# Patient Record
Sex: Female | Born: 1985 | Race: White | Hispanic: No | Marital: Married | Smoking: Never smoker
Health system: Southern US, Community
[De-identification: ages and names within clinical notes are randomized; demographics above are authoritative.]

## PROBLEM LIST (undated history)

## (undated) DIAGNOSIS — T4145XA Adverse effect of unspecified anesthetic, initial encounter: Secondary | ICD-10-CM

## (undated) DIAGNOSIS — K219 Gastro-esophageal reflux disease without esophagitis: Secondary | ICD-10-CM

## (undated) DIAGNOSIS — L309 Dermatitis, unspecified: Secondary | ICD-10-CM

## (undated) DIAGNOSIS — T8859XA Other complications of anesthesia, initial encounter: Secondary | ICD-10-CM

## (undated) HISTORY — DX: Other complications of anesthesia, initial encounter: T88.59XA

## (undated) HISTORY — DX: Gastro-esophageal reflux disease without esophagitis: K21.9

## (undated) HISTORY — DX: Adverse effect of unspecified anesthetic, initial encounter: T41.45XA

## (undated) HISTORY — DX: Dermatitis, unspecified: L30.9

---
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
## 2009-11-01 ENCOUNTER — Ambulatory Visit: Payer: Self-pay | Admitting: Advanced Practice Midwife

## 2010-07-29 NOTE — Assessment & Plan Note (Signed)
NAME:  [name] [name], [name] [name]            ACCOUNT NO.:  [id number]   MEDICAL RECORD NO.:  [id number]          PATIENT TYPE:  POB   LOCATION:  [facility] at [place]         FACILITY:  [facility]   PHYSICIAN:  Wynelle Bourgeois, CNM    DATE OF BIRTH:  04/19/1985   DATE OF SERVICE:  11/01/2009                                  CLINIC NOTE   This is a 25 year old, gravida 0 who is not pregnant and presents for  her annual exam.  She is generally healthy and is requesting a  prescription for her Cyclessa birth control pills.  She is also  considering trying to conceive within the next year and is requesting a  prescription for prenatal vitamins.  She denies any problems with her  health at this time.  She desires a Pap smear, but declines any STD  testing.   HEALTH CARE MAINTENANCE:  The patient has had rubella chickenpox tetanus  and flu vaccines.  Last mammogram was in 2010 and was normal.  Last  dental exam was in February 2011 and was normal.   GYNECOLOGIC HISTORY:  Age of first menses is 12.  Periods are regular  every 29 days and last for 4 days.  She does report them has having  medium flow with mild cramps.   OBSTETRICAL HISTORY:  The patient is a nulligravida.  The patient has  used Cyclessa birth control pills without problems and denies any chest  pain or headaches.  Last Pap was in June 2009 and paps have always been  normal.  There is no history of STDs.   MEDICAL HISTORY:  The patient reports having exercise-induced asthma in  middle school, but has no current problem with that.  She denies any  other health problems.   SURGICAL HISTORY:  Remarkable for wisdom teeth extraction in 2003.   SOCIAL HISTORY:  The patient is employed as a Tax adviser.  She is married and lives with her husband.  She denies  tobacco use.  She does report social alcohol use.  She denies any drug  use.  Lifetime partner is one.   FAMILY HISTORY:  Remarkable for a grandmother with diabetes and  other  family members with heart disease and high blood pressure.   REVIEW OF SYSTEMS:  The patient reports some chest discomfort with  asthma.  She denies any other symptoms.   PHYSICAL EXAMINATION:  Pulse 94, blood pressure 140/87 (the patient  states that she has white coat syndrome and that her blood pressures are  otherwise normal), weight 219, height 68 inches.   ALLERGIES:  NKDA.  LMP October 08, 2009.   PHYSICAL EXAMINATION:  HEENT:  Within normal limits.  Thyroid normal,  not enlarged.  CHEST:  Clear to auscultation.  HEART:  Regular rate and rhythm.  ABDOMEN:  Soft, nontender, no masses.  Abdomen is slightly obese.  GYNECOLOGIC:  EG/BUS within normal limits.  No lesions or discharge are  noted.  Vagina is clean, well rugated with no lesions.  Cervix is  closed, nulliparous, and normal in appearance.  Uterus is small,  midline.  Adnexa are clear with no masses appreciated, although body  habitus limits exam somewhat.  RECTAL:  Deferred.  EXTREMITIES:  Within normal limits.  Pap smear obtained.  The patient  declines STD testing.   ASSESSMENT:  46. A 25 year old gravida 0 presenting for annual exam.  2. Normal exam.   PLAN:  1. [facility] handout on health maintenance given to the      patient.  2. Pap sent.  3. Prescription for Cyclessa 1 p.o. daily #3 months, refilled x1 year      given.  4. Prescription prenatal vitamin 1 p.o. daily #30, refilled x1 year.  5. The patient will return when pregnancy or as needed.  Otherwise;      will return for annual in 1 year.           ______________________________  Wynelle Bourgeois, CNM     MW/MEDQ  D:  11/01/2009  T:  11/02/2009  Job:  [phone]

## 2010-11-06 ENCOUNTER — Encounter: Payer: Self-pay | Admitting: *Deleted

## 2010-11-26 ENCOUNTER — Ambulatory Visit (INDEPENDENT_AMBULATORY_CARE_PROVIDER_SITE_OTHER): Payer: BC Managed Care – PPO | Admitting: *Deleted

## 2010-11-26 ENCOUNTER — Encounter: Payer: Self-pay | Admitting: *Deleted

## 2010-11-26 VITALS — BP 141/70 | Temp 98.6°F | Ht 66.0 in | Wt 208.0 lb

## 2010-11-26 DIAGNOSIS — Z3480 Encounter for supervision of other normal pregnancy, unspecified trimester: Secondary | ICD-10-CM

## 2010-11-26 LAB — ABO/RH: RH Type: POSITIVE

## 2010-11-26 LAB — CBC: Platelets: 281 10*3/uL (ref 150–399)

## 2010-11-26 LAB — HEPATITIS B SURFACE ANTIGEN: Hepatitis B Surface Ag: NEGATIVE

## 2010-11-26 NOTE — Progress Notes (Signed)
Pt here with husband for NOB intake.  Medical history reviewed with pt.  Pt and husband are excited about pregnancy and this is a planned pregnancy as pt went off OCP several months ago.  Genetic screening reviewed with pt and integrated info given.  Bedside U/S showed IUP with GA of [redacted]wks and positive FHT of 133BPM.  PN Labs drawn including CF screening.  Pt is currently taking PN vitamins and has been x 1 year.  Pt scheduled for PN exam 12/08/10 and will need a pap smear at that time.  All questions answered and encourage or to call if any questions or concerns.

## 2010-11-27 LAB — OBSTETRIC PANEL
Antibody Screen: NEGATIVE
Basophils Absolute: 0 10*3/uL (ref 0.0–0.1)
Basophils Relative: 0 % (ref 0–1)
Eosinophils Absolute: 0.1 10*3/uL (ref 0.0–0.7)
HCT: 37.9 % (ref 36.0–46.0)
Hemoglobin: 12.2 g/dL (ref 12.0–15.0)
MCH: 29.4 pg (ref 26.0–34.0)
MCHC: 32.2 g/dL (ref 30.0–36.0)
Monocytes Absolute: 0.7 10*3/uL (ref 0.1–1.0)
Monocytes Relative: 8 % (ref 3–12)
Neutro Abs: 6.5 10*3/uL (ref 1.7–7.7)
RDW: 13.3 % (ref 11.5–15.5)
Rh Type: POSITIVE

## 2010-11-27 LAB — HIV ANTIBODY (ROUTINE TESTING W REFLEX): HIV: NONREACTIVE

## 2010-11-30 LAB — CULTURE, URINE COMPREHENSIVE: Colony Count: 15000

## 2010-12-08 ENCOUNTER — Ambulatory Visit (INDEPENDENT_AMBULATORY_CARE_PROVIDER_SITE_OTHER): Payer: BC Managed Care – PPO | Admitting: Physician Assistant

## 2010-12-08 VITALS — BP 145/80 | Temp 97.9°F | Wt 209.0 lb

## 2010-12-08 DIAGNOSIS — Z3400 Encounter for supervision of normal first pregnancy, unspecified trimester: Secondary | ICD-10-CM

## 2010-12-08 LAB — CYSTIC FIBROSIS DIAGNOSTIC STUDY: Date of Birth: [birth year]

## 2010-12-08 NOTE — Patient Instructions (Signed)
Pregnancy - First Trimester During sexual intercourse, millions of sperm go into the vagina. Only 1 sperm will penetrate and fertilize the female egg while it is in the Fallopian tube. One week later, the fertilized egg implants into the wall of the uterus. An embryo begins to develop into a baby. At 6 to 8 weeks, the eyes and face are formed and the heartbeat can be seen on ultrasound. At the end of 12 weeks (first trimester), all the baby's organs are formed. Now that you are pregnant, you will want to do everything you can to have a healthy baby. Two of the most important things are to get good prenatal care and follow your caregiver's instructions. Prenatal care is all the medical care you receive before the baby's birth. It is given to prevent, find and treat problems during the pregnancy and childbirth. PRENATAL EXAMS:  During prenatal visits, your weight, blood pressure and urine are checked. This is done to make sure you are healthy and progressing normally during the pregnancy.   A pregnant woman should gain 25 to 35 pounds during the pregnancy. However, if you are over weight or underweight, your caregiver will advise you regarding your weight.   Your caregiver will ask and answer questions for you.   Blood work, cervical cultures, other necessary tests and a Pap test are done during your prenatal exams. These tests are done to check on your health and the probable health of your baby. Tests are strongly recommended and done for HIV with your permission. This is the virus that causes AIDS. These tests are done because medications can be given to help prevent your baby from being born with this infection should you have been infected without knowing it. Blood work is also used to find out your blood type, previous infections and follow your blood levels (hemoglobin).   Low hemoglobin (anemia) is common during pregnancy. Iron and vitamins are given to help prevent this. Later in the pregnancy,  blood tests for diabetes will be done along with any other tests if any problems develop. You may need tests to make sure you and the baby are doing well.   You may need other tests to make sure you and the baby are doing well.  CHANGES DURING THE FIRST TRIMESTER (THE FIRST 3 MONTHS OF PREGNANCY) Your body goes through many changes during pregnancy. They vary from person to person. Talk to your caregiver about changes you notice and are concerned about. Changes can include:  Your menstrual period stops.   The egg and sperm carry the genes that determine what you look like. Genes from you and your partner are forming a baby. The female genes determine whether the baby is a boy or a girl.   Your body increases in girth and you may feel bloated.   Feeling sick to your stomach (nauseous) and throwing up (vomiting). If the vomiting is uncontrollable, call your caregiver.   Your breasts will begin to enlarge and become tender.   Your nipples may stick out more and become darker.   The need to urinate more. Painful urination may mean you have a bladder infection.   Tiring easily.   Loss of appetite.   Cravings for certain kinds of food.   At first, you may gain or lose a couple of pounds.   You may have changes in your emotions from day to day (excited to be pregnant or concerned something may go wrong with the pregnancy and baby).     You may have more vivid and strange dreams.  HOME CARE INSTRUCTIONS  It is very important to avoid all smoking, alcohol and un-prescribed drugs during your pregnancy. These affect the formation and growth of the baby. Avoid chemicals while pregnant to ensure the delivery of a healthy infant.   Start your prenatal visits by the 12th week of pregnancy. They are usually scheduled monthly at first, then more often in the last 2 months before delivery. Keep your caregiver's appointments. Follow your caregiver's instructions regarding medication use, blood and lab  tests, exercise, and diet.   During pregnancy, you are providing food for you and your baby. Eat regular, well-balanced meals. Choose foods such as meat, fish, milk and other low fat dairy products, vegetables, fruits, and whole-grain breads and cereals. Your caregiver will tell you of the ideal weight gain.   You can help morning sickness by keeping soda crackers (saltines) at the bedside. Eat a couple before arising in the morning. You may want to use the crackers without salt on them.   Eating 4 to 5 small meals rather than 3 large meals a day also may help the nausea and vomiting.   Drinking liquids between meals instead of during meals also seems to help nausea and vomiting.   A physical sexual relationship may be continued throughout pregnancy if there are no other problems. Problems may be early (premature) leaking of amniotic fluid from the membranes, vaginal bleeding, or belly (abdominal) pain.   Exercise regularly if there are no restrictions. Check with your caregiver or physical therapist if you are unsure of the safety of some of your exercises. Greater weight gain will occur in the last 2 trimesters of pregnancy. Exercising will help:   Control your weight.   Keep you in shape.   Prepare you for labor and delivery.   Help you lose your pregnancy weight after you deliver your baby.   Wear a good support or jogging bra for breast tenderness during pregnancy. This may help if worn during sleep too.   Ask when prenatal classes are available. Begin classes when they are offered.   Do not use hot tubs, steam rooms or saunas.   Wear your seat belt when driving. This protects you and your baby if you are in an accident.   Avoid raw meat, uncooked cheese, cat litter boxes and soil used by cats throughout the pregnancy. These carry germs that can cause birth defects in the baby.   The first trimester is a good time to visit your dentist for your dental health. Getting your teeth  cleaned is OK. Use a softer toothbrush and brush gently during pregnancy.   Ask for help if you have financial, counseling or nutritional needs during pregnancy. Your caregiver will be able to offer counseling for these needs as well as refer you for other special needs.   Do not take any medications or herbs unless told by your caregiver.   Inform your caregiver if there is any mental or physical domestic violence.   Make a list of emergency phone numbers of family, friends, hospital, police and fire department.   Write down your questions. Take them to your prenatal visit.   Do not douche.   Do not cross your legs.   If you have to stand for long periods of time, rotate you feet or take small steps in a circle.   You may have more vaginal secretions that may require a sanitary pad. Do not use tampons   or scented sanitary pads.  MEDICATIONS AND DRUG USE IN PREGNANCY  Take prenatal vitamins as directed. The vitamin should contain 1 milligram of folic acid. Keep all vitamins out of reach of children. Only a couple vitamins or tablets containing iron may be fatal to a baby or young child when ingested.   Avoid use of all medications, including herbs, over-the-counter medications, not prescribed or suggested by your caregiver. Only take over-the-counter or prescription medicines for pain, discomfort, or fever as directed by your caregiver. Do not use aspirin, ibuprofen (Motrin, Advil, Nuprin) or naproxen (Aleve) unless OK'd by your caregiver.   Let your caregiver also know about herbs you may be using.   Alcohol is related to a number of birth defects. This includes fetal alcohol syndrome. All alcohol, in any form, should be avoided completely. Smoking will cause low birth rate and premature babies.   Street/illegal drugs are very harmful to the baby. They are absolutely forbidden. A baby born to an addicted mother will be addicted at birth. The baby will go through the same withdrawal  an adult does.   Let your caregiver know about any medications that you have to take and for what reason you take them.  MISCARRIAGE IS COMMON DURING PREGNANCY A miscarriage does not mean you did something wrong. It is not a reason to worry about getting pregnant again. Your caregiver will help you with questions you may have. If you have a miscarriage, you may need minor surgery (a D & C). SEEK MEDICAL CARE IF:  You have any concerns or worries during your pregnancy. It is better to call with your questions if you feel they cannot wait, rather than worry about them.  SEEK IMMEDIATE MEDICAL CARE IF:  An unexplained oral temperature above 100.5 develops, or as your caregiver suggests.   You have leaking of fluid from the vagina (birth canal). If leaking membranes are suspected, take your temperature and inform your caregiver of this when you call.   There is vaginal spotting or bleeding. Notify your caregiver of the amount and how many pads are used.   You develop a bad smelling vaginal discharge with a change in the color.   You continue to feel sick to your stomach (nauseated) and have no relief from remedies suggested. You vomit blood or coffee ground like materials.   You lose more than 2 pounds of weight in one week.   You gain more than 2 pounds of weight in a week and you notice swelling of your face, hands, feet or legs.   You gain 5 pounds or more in 1 week (even if you do not have swelling of your hands, face, legs or feet).   You get exposed to German measles and have never had them.   You are exposed to fifth disease or chicken pox.   You develop belly (abdominal) pain. Round ligament discomfort is a common non-cancerous (benign) cause of abdominal pain in pregnancy. Your caregiver still must evaluate this.   You develop headache, fever, diarrhea, pain with urination, or shortness of breath.   You fall, are in a car accident or have any kind of trauma.   There is mental  or physical violence in your home.  Document Released: 02/24/2001 Document Re-Released: 08/20/2009 [employer] Patient Information 2011 [employer]. 

## 2010-12-08 NOTE — Progress Notes (Signed)
No hx of HTN. Will perform BL 24 hour urine and labs at 12 week visit. Declines integrated screen.   Subjective:    [name] [name] is a G1P0000 [redacted]w[redacted]d being seen today for her first obstetrical visit.  Her obstetrical history is significant for ? HTN.  Pregnancy history fully reviewed. Pt reports elevated BP at Annual exam visit last year, however, was told NL BP at last two PCP visits. Taking Sudafed for cold s/s.  Patient reports no complaints.  Filed Vitals:   12/08/10 1422  BP: 145/80  Temp: 97.9 F (36.6 C)  Weight: 209 lb (94.802 kg)    HISTORY: OB History    Grav Para Term Preterm Abortions TAB SAB Ect Mult Living   1 0 0 0 0 0 0 0 0 0      # Outc Date GA Lbr Len/2nd Wgt Sex Del Anes PTL Lv   1 CUR              Past Medical History  Diagnosis Date  . Asthma     exercise induced in middle school  . Complication of anesthesia     laughing gas, dreaming episode   Past Surgical History  Procedure Date  . Wisdom tooth extraction 2003   Family History  Problem Relation Age of Onset  . Drug abuse Maternal Grandmother   . Heart disease Maternal Grandmother   . Diabetes Paternal Grandmother   . Hypertension Paternal Grandmother   . Stroke Paternal Grandfather   . COPD Paternal Grandfather      Exam    Uterine Size: 10 cm                                 System: Breast:  normal appearance, no masses or tenderness, No nipple retraction or dimpling, No nipple discharge or bleeding   Skin: normal coloration and turgor, no rashes    Neurologic: oriented, normal, negative   Extremities: normal strength, tone, and muscle mass   HEENT thyroid without masses   Mouth/Teeth mucous membranes moist, pharynx normal without lesions and dental hygiene good   Neck supple and no masses   Cardiovascular: regular rate and rhythm, no murmurs or gallops   Respiratory:  appears well, vitals normal, no respiratory distress, acyanotic, normal RR, ear and throat exam is normal,  neck free of mass or lymphadenopathy, chest clear, no wheezing, crepitations, rhonchi, normal symmetric air entry   Abdomen: soft, non-tender; bowel sounds normal; no masses,  no organomegaly          Assessment:    Pregnancy: G1P0000 There is no problem list on file for this patient.       Plan:     Initial labs drawn.Last Pap 10/2009, no hx of abnormal Prenatal vitamins. Problem list reviewed and updated. Genetic Screening discussed Integrated Screen: declined.  Ultrasound discussed; fetal survey: to be assessed at 18 weeks.  Follow up in 3 weeks. BL Pre-x labs & 24 hour urine at 12 week visit  Pryor Guettler E. 12/08/2010

## 2010-12-10 ENCOUNTER — Encounter: Payer: Self-pay | Admitting: *Deleted

## 2010-12-29 ENCOUNTER — Ambulatory Visit (INDEPENDENT_AMBULATORY_CARE_PROVIDER_SITE_OTHER): Payer: BC Managed Care – PPO | Admitting: Advanced Practice Midwife

## 2010-12-29 DIAGNOSIS — T8859XA Other complications of anesthesia, initial encounter: Secondary | ICD-10-CM

## 2010-12-29 DIAGNOSIS — J45990 Exercise induced bronchospasm: Secondary | ICD-10-CM | POA: Insufficient documentation

## 2010-12-29 DIAGNOSIS — Z23 Encounter for immunization: Secondary | ICD-10-CM

## 2010-12-29 DIAGNOSIS — Z3480 Encounter for supervision of other normal pregnancy, unspecified trimester: Secondary | ICD-10-CM

## 2010-12-29 DIAGNOSIS — T4145XA Adverse effect of unspecified anesthetic, initial encounter: Secondary | ICD-10-CM | POA: Insufficient documentation

## 2010-12-29 NOTE — Patient Instructions (Signed)
rine Protein, 24 Hour Urine Protein   This is a test used to detect excessive protein escaping into the urine, to help evaluate and monitor kidney function, and to detect kidney damage. Proteins usually are not found in the urine. The kidneys (two organs found in the back at the bottom of the rib cage) filter the blood, removing wastes and excreting them out of the body in the form of urine. When the kidneys are functioning normally, they retain or reabsorb filtered protein molecules and return them to the blood. However, if the kidneys are damaged, they become less effective at filtering, and detectible amounts of protein begin to find their way into the urine. Often, it is the smaller albumin molecules that are detected first. If the damage continues, the amount of protein in the urine increases, and globulins may also begin to be lost.    Proteinuria (protein in the urine) is frequently seen in chronic diseases, such as diabetes and hypertension, with increasing amounts of protein in the urine reflecting increasing kidney damage. With early kidney damage, the patient is often asymptomatic. As damage progresses or if protein loss is severe, the patient may have symptoms such as edema (swelling and fluid retention), shortness of breath, nausea, and fatigue. Excess protein production, such as may be seen with multiple myeloma, can also lead to proteinuria.   PREPARATION A random urine sample is collected in a clean container. For a 24-hour urine collection, all of the urine is collected for a 24-hour period. It is important that the sample be refrigerated during this time period. There should be no preservative in the container.   PREPARATION FOR TEST: A blood sample is obtained by inserting a needle into a vein in the arm.   NORMAL RANGE VALUES: 0-8 mg/dL 11-91 YN/82 hr (at rest) Less than 250 mg/24 hr (during exercise)   YOUR VALUE WAS: __________________________________________________     MEANING OF TEST Your caregiver will go over the test results with you and discuss the importance and meaning of your results, as well as treatment options and the need for additional tests if necessary.   OBTAINING THE TEST RESULTS It is your responsibility to obtain your test results.  Ask the lab or department performing the test when and how you will get your results.   **"Normal" ranges for lab values and other tests may vary among different laboratories and/or hospitals.  You should always check with your doctor after having lab work or other tests done to discuss the meaning of your test results and whether or not your values are considered "within normal limits".   Document Released: 03/27/2004  Document Re-Released: 02/13/2008 [facility] Patient Information 2011 [place], [place].  Pregnancy - First Trimester During sexual intercourse, millions of sperm go into the vagina. Only 1 sperm will penetrate and fertilize the female egg while it is in the Fallopian tube. One week later, the fertilized egg implants into the wall of the uterus. An embryo begins to develop into a baby. At 6 to 8 weeks, the eyes and face are formed and the heartbeat can be seen on ultrasound. At the end of 12 weeks (first trimester), all the baby's organs are formed. Now that you are pregnant, you will want to do everything you can to have a healthy baby. Two of the most important things are to get good prenatal care and follow your caregiver's instructions. Prenatal care is all the medical care you receive before the baby's birth. It is given to  prevent, find and treat problems during the pregnancy and childbirth. PRENATAL EXAMS:  During prenatal visits, your weight, blood pressure and urine are checked. This is done to make sure you are healthy and progressing normally during the pregnancy.   A pregnant woman should gain 25 to 35 pounds during the pregnancy. However, if you are over weight or underweight, your caregiver  will advise you regarding your weight.   Your caregiver will ask and answer questions for you.   Blood work, cervical cultures, other necessary tests and a Pap test are done during your prenatal exams. These tests are done to check on your health and the probable health of your baby. Tests are strongly recommended and done for HIV with your permission. This is the virus that causes AIDS. These tests are done because medications can be given to help prevent your baby from being born with this infection should you have been infected without knowing it. Blood work is also used to find out your blood type, previous infections and follow your blood levels (hemoglobin).   Low hemoglobin (anemia) is common during pregnancy. Iron and vitamins are given to help prevent this. Later in the pregnancy, blood tests for diabetes will be done along with any other tests if any problems develop. You may need tests to make sure you and the baby are doing well.   You may need other tests to make sure you and the baby are doing well.  CHANGES DURING THE FIRST TRIMESTER (THE FIRST 3 MONTHS OF PREGNANCY) Your body goes through many changes during pregnancy. They vary from person to person. Talk to your caregiver about changes you notice and are concerned about. Changes can include:  Your menstrual period stops.   The egg and sperm carry the genes that determine what you look like. Genes from you and your partner are forming a baby. The female genes determine whether the baby is a boy or a girl.   Your body increases in girth and you may feel bloated.   Feeling sick to your stomach (nauseous) and throwing up (vomiting). If the vomiting is uncontrollable, call your caregiver.   Your breasts will begin to enlarge and become tender.   Your nipples may stick out more and become darker.   The need to urinate more. Painful urination may mean you have a bladder infection.   Tiring easily.   Loss of appetite.   Cravings  for certain kinds of food.   At first, you may gain or lose a couple of pounds.   You may have changes in your emotions from day to day (excited to be pregnant or concerned something may go wrong with the pregnancy and baby).   You may have more vivid and strange dreams.  HOME CARE INSTRUCTIONS  It is very important to avoid all smoking, alcohol and un-prescribed drugs during your pregnancy. These affect the formation and growth of the baby. Avoid chemicals while pregnant to ensure the delivery of a healthy infant.   Start your prenatal visits by the 12th week of pregnancy. They are usually scheduled monthly at first, then more often in the last 2 months before delivery. Keep your caregiver's appointments. Follow your caregiver's instructions regarding medication use, blood and lab tests, exercise, and diet.   During pregnancy, you are providing food for you and your baby. Eat regular, well-balanced meals. Choose foods such as meat, fish, milk and other low fat dairy products, vegetables, fruits, and whole-grain breads and cereals. Your caregiver  will tell you of the ideal weight gain.   You can help morning sickness by keeping soda crackers (saltines) at the bedside. Eat a couple before arising in the morning. You may want to use the crackers without salt on them.   Eating 4 to 5 small meals rather than 3 large meals a day also may help the nausea and vomiting.   Drinking liquids between meals instead of during meals also seems to help nausea and vomiting.   A physical sexual relationship may be continued throughout pregnancy if there are no other problems. Problems may be early (premature) leaking of amniotic fluid from the membranes, vaginal bleeding, or belly (abdominal) pain.   Exercise regularly if there are no restrictions. Check with your caregiver or physical therapist if you are unsure of the safety of some of your exercises. Greater weight gain will occur in the last 2 trimesters of  pregnancy. Exercising will help:   Control your weight.   Keep you in shape.   Prepare you for labor and delivery.   Help you lose your pregnancy weight after you deliver your baby.   Wear a good support or jogging bra for breast tenderness during pregnancy. This may help if worn during sleep too.   Ask when prenatal classes are available. Begin classes when they are offered.   Do not use hot tubs, steam rooms or saunas.   Wear your seat belt when driving. This protects you and your baby if you are in an accident.   Avoid raw meat, uncooked cheese, cat litter boxes and soil used by cats throughout the pregnancy. These carry germs that can cause birth defects in the baby.   The first trimester is a good time to visit your dentist for your dental health. Getting your teeth cleaned is OK. Use a softer toothbrush and brush gently during pregnancy.   Ask for help if you have financial, counseling or nutritional needs during pregnancy. Your caregiver will be able to offer counseling for these needs as well as refer you for other special needs.   Do not take any medications or herbs unless told by your caregiver.   Inform your caregiver if there is any mental or physical domestic violence.   Make a list of emergency phone numbers of family, friends, hospital, police and fire department.   Write down your questions. Take them to your prenatal visit.   Do not douche.   Do not cross your legs.   If you have to stand for long periods of time, rotate you feet or take small steps in a circle.   You may have more vaginal secretions that may require a sanitary pad. Do not use tampons or scented sanitary pads.  MEDICATIONS AND DRUG USE IN PREGNANCY  Take prenatal vitamins as directed. The vitamin should contain 1 milligram of folic acid. Keep all vitamins out of reach of children. Only a couple vitamins or tablets containing iron may be fatal to a baby or young child when ingested.   Avoid  use of all medications, including herbs, over-the-counter medications, not prescribed or suggested by your caregiver. Only take over-the-counter or prescription medicines for pain, discomfort, or fever as directed by your caregiver. Do not use aspirin, ibuprofen (Motrin, Advil, Nuprin) or naproxen (Aleve) unless OK'd by your caregiver.   Let your caregiver also know about herbs you may be using.   Alcohol is related to a number of birth defects. This includes fetal alcohol syndrome. All alcohol, in  any form, should be avoided completely. Smoking will cause low birth rate and premature babies.   Street/illegal drugs are very harmful to the baby. They are absolutely forbidden. A baby born to an addicted mother will be addicted at birth. The baby will go through the same withdrawal an adult does.   Let your caregiver know about any medications that you have to take and for what reason you take them.  MISCARRIAGE IS COMMON DURING PREGNANCY A miscarriage does not mean you did something wrong. It is not a reason to worry about getting pregnant again. Your caregiver will help you with questions you may have. If you have a miscarriage, you may need minor surgery (a D & C). SEEK MEDICAL CARE IF:  You have any concerns or worries during your pregnancy. It is better to call with your questions if you feel they cannot wait, rather than worry about them.  SEEK IMMEDIATE MEDICAL CARE IF:  An unexplained oral temperature above 100.4 develops, or as your caregiver suggests.   You have leaking of fluid from the vagina (birth canal). If leaking membranes are suspected, take your temperature and inform your caregiver of this when you call.   There is vaginal spotting or bleeding. Notify your caregiver of the amount and how many pads are used.   You develop a bad smelling vaginal discharge with a change in the color.   You continue to feel sick to your stomach (nauseated) and have no relief from remedies  suggested. You vomit blood or coffee ground like materials.   You lose more than 2 pounds of weight in one week.   You gain more than 2 pounds of weight in a week and you notice swelling of your face, hands, feet or legs.   You gain 5 pounds or more in 1 week (even if you do not have swelling of your hands, face, legs or feet).   You get exposed to [place] measles and have never had them.   You are exposed to fifth disease or chicken pox.   You develop belly (abdominal) pain. Round ligament discomfort is a common non-cancerous (benign) cause of abdominal pain in pregnancy. Your caregiver still must evaluate this.   You develop headache, fever, diarrhea, pain with urination, or shortness of breath.   You fall, are in a car accident or have any kind of trauma.   There is mental or physical violence in your home.  Document Released: 02/24/2001 Document Re-Released: 08/20/2009 [facility] Patient Information 2011 [place], [place].

## 2010-12-29 NOTE — Progress Notes (Signed)
Reviewed NOB labs. 24 urine collection materials and instructions given to bring to [facility] due to mild HTN.

## 2010-12-29 NOTE — Progress Notes (Signed)
p=91 

## 2011-01-26 ENCOUNTER — Ambulatory Visit (INDEPENDENT_AMBULATORY_CARE_PROVIDER_SITE_OTHER): Payer: BC Managed Care – PPO | Admitting: Advanced Practice Midwife

## 2011-01-26 VITALS — BP 142/74 | Temp 98.6°F | Wt 212.0 lb

## 2011-01-26 DIAGNOSIS — O10019 Pre-existing essential hypertension complicating pregnancy, unspecified trimester: Secondary | ICD-10-CM

## 2011-01-26 DIAGNOSIS — Z3490 Encounter for supervision of normal pregnancy, unspecified, unspecified trimester: Secondary | ICD-10-CM

## 2011-01-26 DIAGNOSIS — O10919 Unspecified pre-existing hypertension complicating pregnancy, unspecified trimester: Secondary | ICD-10-CM

## 2011-01-26 DIAGNOSIS — Z3480 Encounter for supervision of other normal pregnancy, unspecified trimester: Secondary | ICD-10-CM

## 2011-01-26 NOTE — Progress Notes (Signed)
Declines quad. Anat u/s scheduled. 24 hour urine and baseline labs drawn today. Feeling well, no c/o except occasional headaches that resolve with tylenol. Rev'd precautions.

## 2011-01-26 NOTE — Progress Notes (Signed)
p-86 24 hr urine turned in today

## 2011-01-26 NOTE — Patient Instructions (Signed)
Pregnancy - Second Trimester The second trimester of pregnancy (3 to 6 months) is a period of rapid growth for you and your baby. At the end of the sixth month, your baby is about 9 inches long and weighs 1 1/2 pounds. You will begin to feel the baby move between 18 and 20 weeks of the pregnancy. This is called quickening. Weight gain is faster. A clear fluid (colostrum) may leak out of your breasts. You may feel small contractions of the womb (uterus). This is known as false labor or Braxton-Hicks contractions. This is like a practice for labor when the baby is ready to be born. Usually, the problems with morning sickness have usually passed by the end of your first trimester. Some women develop small dark blotches (called cholasma, mask of pregnancy) on their face that usually goes away after the baby is born. Exposure to the sun makes the blotches worse. Acne may also develop in some pregnant women and pregnant women who have acne, may find that it goes away. PRENATAL EXAMS  Blood work may continue to be done during prenatal exams. These tests are done to check on your health and the probable health of your baby. Blood work is used to follow your blood levels (hemoglobin). Anemia (low hemoglobin) is common during pregnancy. Iron and vitamins are given to help prevent this. You will also be checked for diabetes between 24 and 28 weeks of the pregnancy. Some of the previous blood tests may be repeated.   The size of the uterus is measured during each visit. This is to make sure that the baby is continuing to grow properly according to the dates of the pregnancy.   Your blood pressure is checked every prenatal visit. This is to make sure you are not getting toxemia.   Your urine is checked to make sure you do not have an infection, diabetes or protein in the urine.   Your weight is checked often to make sure gains are happening at the suggested rate. This is to ensure that both you and your baby are  growing normally.   Sometimes, an ultrasound is performed to confirm the proper growth and development of the baby. This is a test which bounces harmless sound waves off the baby so your caregiver can more accurately determine due dates.  Sometimes, a specialized test is done on the amniotic fluid surrounding the baby. This test is called an amniocentesis. The amniotic fluid is obtained by sticking a needle into the belly (abdomen). This is done to check the chromosomes in instances where there is a concern about possible genetic problems with the baby. It is also sometimes done near the end of pregnancy if an early delivery is required. In this case, it is done to help make sure the baby's lungs are mature enough for the baby to live outside of the womb. CHANGES OCCURING IN THE SECOND TRIMESTER OF PREGNANCY Your body goes through many changes during pregnancy. They vary from person to person. Talk to your caregiver about changes you notice that you are concerned about.  During the second trimester, you will likely have an increase in your appetite. It is normal to have cravings for certain foods. This varies from person to person and pregnancy to pregnancy.   Your lower abdomen will begin to bulge.   You may have to urinate more often because the uterus and baby are pressing on your bladder. It is also common to get more bladder infections during pregnancy (  pain with urination). You can help this by drinking lots of fluids and emptying your bladder before and after intercourse.   You may begin to get stretch marks on your hips, abdomen, and breasts. These are normal changes in the body during pregnancy. There are no exercises or medications to take that prevent this change.   You may begin to develop swollen and bulging veins (varicose veins) in your legs. Wearing support hose, elevating your feet for 15 minutes, 3 to 4 times a day and limiting salt in your diet helps lessen the problem.    Heartburn may develop as the uterus grows and pushes up against the stomach. Antacids recommended by your caregiver helps with this problem. Also, eating smaller meals 4 to 5 times a day helps.   Constipation can be treated with a stool softener or adding bulk to your diet. Drinking lots of fluids, vegetables, fruits, and whole grains are helpful.   Exercising is also helpful. If you have been very active up until your pregnancy, most of these activities can be continued during your pregnancy. If you have been less active, it is helpful to start an exercise program such as walking.   Hemorrhoids (varicose veins in the rectum) may develop at the end of the second trimester. Warm sitz baths and hemorrhoid cream recommended by your caregiver helps hemorrhoid problems.   Backaches may develop during this time of your pregnancy. Avoid heavy lifting, wear low heal shoes and practice good posture to help with backache problems.   Some pregnant women develop tingling and numbness of their hand and fingers because of swelling and tightening of ligaments in the wrist (carpel tunnel syndrome). This goes away after the baby is born.   As your breasts enlarge, you may have to get a bigger bra. Get a comfortable, cotton, support bra. Do not get a nursing bra until the last month of the pregnancy if you will be nursing the baby.   You may get a dark line from your belly button to the pubic area called the linea nigra.   You may develop rosy cheeks because of increase blood flow to the face.   You may develop spider looking lines of the face, neck, arms and chest. These go away after the baby is born.  HOME CARE INSTRUCTIONS   It is extremely important to avoid all smoking, herbs, alcohol, and unprescribed drugs during your pregnancy. These chemicals affect the formation and growth of the baby. Avoid these chemicals throughout the pregnancy to ensure the delivery of a healthy infant.   Most of your home  care instructions are the same as suggested for the first trimester of your pregnancy. Keep your caregiver's appointments. Follow your caregiver's instructions regarding medication use, exercise and diet.   During pregnancy, you are providing food for you and your baby. Continue to eat regular, well-balanced meals. Choose foods such as meat, fish, milk and other low fat dairy products, vegetables, fruits, and whole-grain breads and cereals. Your caregiver will tell you of the ideal weight gain.   A physical sexual relationship may be continued up until near the end of pregnancy if there are no other problems. Problems could include early (premature) leaking of amniotic fluid from the membranes, vaginal bleeding, abdominal pain, or other medical or pregnancy problems.   Exercise regularly if there are no restrictions. Check with your caregiver if you are unsure of the safety of some of your exercises. The greatest weight gain will occur in the   last 2 trimesters of pregnancy. Exercise will help you:   Control your weight.   Get you in shape for labor and delivery.   Lose weight after you have the baby.   Wear a good support or jogging bra for breast tenderness during pregnancy. This may help if worn during sleep. Pads or tissues may be used in the bra if you are leaking colostrum.   Do not use hot tubs, steam rooms or saunas throughout the pregnancy.   Wear your seat belt at all times when driving. This protects you and your baby if you are in an accident.   Avoid raw meat, uncooked cheese, cat litter boxes and soil used by cats. These carry germs that can cause birth defects in the baby.   The second trimester is also a good time to visit your dentist for your dental health if this has not been done yet. Getting your teeth cleaned is OK. Use a soft toothbrush. Brush gently during pregnancy.   It is easier to loose urine during pregnancy. Tightening up and strengthening the pelvic muscles will  help with this problem. Practice stopping your urination while you are going to the bathroom. These are the same muscles you need to strengthen. It is also the muscles you would use as if you were trying to stop from passing gas. You can practice tightening these muscles up 10 times a set and repeating this about 3 times per day. Once you know what muscles to tighten up, do not perform these exercises during urination. It is more likely to contribute to an infection by backing up the urine.   Ask for help if you have financial, counseling or nutritional needs during pregnancy. Your caregiver will be able to offer counseling for these needs as well as refer you for other special needs.   Your skin may become oily. If so, wash your face with mild soap, use non-greasy moisturizer and oil or cream based makeup.  MEDICATIONS AND DRUG USE IN PREGNANCY  Take prenatal vitamins as directed. The vitamin should contain 1 milligram of folic acid. Keep all vitamins out of reach of children. Only a couple vitamins or tablets containing iron may be fatal to a baby or young child when ingested.   Avoid use of all medications, including herbs, over-the-counter medications, not prescribed or suggested by your caregiver. Only take over-the-counter or prescription medicines for pain, discomfort, or fever as directed by your caregiver. Do not use aspirin.   Let your caregiver also know about herbs you may be using.   Alcohol is related to a number of birth defects. This includes fetal alcohol syndrome. All alcohol, in any form, should be avoided completely. Smoking will cause low birth rate and premature babies.   Street or illegal drugs are very harmful to the baby. They are absolutely forbidden. A baby born to an addicted mother will be addicted at birth. The baby will go through the same withdrawal an adult does.  SEEK MEDICAL CARE IF:  You have any concerns or worries during your pregnancy. It is better to call with  your questions if you feel they cannot wait, rather than worry about them. SEEK IMMEDIATE MEDICAL CARE IF:   An unexplained oral temperature above 102 F (38.9 C) develops, or as your caregiver suggests.   You have leaking of fluid from the vagina (birth canal). If leaking membranes are suspected, take your temperature and tell your caregiver of this when you call.   There   is vaginal spotting, bleeding, or passing clots. Tell your caregiver of the amount and how many pads are used. Light spotting in pregnancy is common, especially following intercourse.   You develop a bad smelling vaginal discharge with a change in the color from clear to white.   You continue to feel sick to your stomach (nauseated) and have no relief from remedies suggested. You vomit blood or coffee ground-like materials.   You lose more than 2 pounds of weight or gain more than 2 pounds of weight over 1 week, or as suggested by your caregiver.   You notice swelling of your face, hands, feet, or legs.   You get exposed to German measles and have never had them.   You are exposed to fifth disease or chickenpox.   You develop belly (abdominal) pain. Round ligament discomfort is a common non-cancerous (benign) cause of abdominal pain in pregnancy. Your caregiver still must evaluate you.   You develop a bad headache that does not go away.   You develop fever, diarrhea, pain with urination, or shortness of breath.   You develop visual problems, blurry, or double vision.   You fall or are in a car accident or any kind of trauma.   There is mental or physical violence at home.  Document Released: 02/24/2001 Document Revised: 11/12/2010 Document Reviewed: 08/29/2008 [employer] Patient Information 2012 [employer]. 

## 2011-01-26 NOTE — Progress Notes (Signed)
Addended by: Granville Lewis on: 01/26/2011 09:54 AM   Modules accepted: Orders

## 2011-01-27 LAB — PROTEIN, URINE, 24 HOUR: Protein, 24H Urine: 54 mg/d (ref 50–100)

## 2011-01-27 LAB — COMPREHENSIVE METABOLIC PANEL
ALT: 12 U/L (ref 0–35)
AST: 15 U/L (ref 0–37)
Albumin: 4.2 g/dL (ref 3.5–5.2)
CO2: 22 mEq/L (ref 19–32)
Calcium: 9.1 mg/dL (ref 8.4–10.5)
Chloride: 105 mEq/L (ref 96–112)
Potassium: 4.4 mEq/L (ref 3.5–5.3)
Sodium: 139 mEq/L (ref 135–145)
Total Protein: 6.4 g/dL (ref 6.0–8.3)

## 2011-01-27 LAB — CBC
MCV: 92.4 fL (ref 78.0–100.0)
Platelets: 239 10*3/uL (ref 150–400)
RBC: 3.96 MIL/uL (ref 3.87–5.11)
RDW: 13.1 % (ref 11.5–15.5)
WBC: 8.8 10*3/uL (ref 4.0–10.5)

## 2011-01-27 LAB — TSH: TSH: 5.285 u[IU]/mL — ABNORMAL HIGH (ref 0.350–4.500)

## 2011-01-31 ENCOUNTER — Telehealth: Payer: Self-pay | Admitting: Advanced Practice Midwife

## 2011-01-31 DIAGNOSIS — E079 Disorder of thyroid, unspecified: Secondary | ICD-10-CM

## 2011-01-31 NOTE — Telephone Encounter (Signed)
Please call patient, TSH elevated, needs more lab work.

## 2011-02-02 ENCOUNTER — Other Ambulatory Visit (INDEPENDENT_AMBULATORY_CARE_PROVIDER_SITE_OTHER): Payer: BC Managed Care – PPO | Admitting: *Deleted

## 2011-02-02 DIAGNOSIS — R7989 Other specified abnormal findings of blood chemistry: Secondary | ICD-10-CM

## 2011-02-23 ENCOUNTER — Ambulatory Visit (HOSPITAL_COMMUNITY)
Admission: RE | Admit: 2011-02-23 | Discharge: 2011-02-23 | Disposition: A | Discharge status: Home / Self Care | Payer: BC Managed Care – PPO | Source: Ambulatory Visit | Attending: Advanced Practice Midwife | Admitting: Advanced Practice Midwife

## 2011-02-23 DIAGNOSIS — Z1389 Encounter for screening for other disorder: Secondary | ICD-10-CM | POA: Insufficient documentation

## 2011-02-23 DIAGNOSIS — O358XX0 Maternal care for other (suspected) fetal abnormality and damage, not applicable or unspecified: Secondary | ICD-10-CM | POA: Insufficient documentation

## 2011-02-23 DIAGNOSIS — Z3490 Encounter for supervision of normal pregnancy, unspecified, unspecified trimester: Secondary | ICD-10-CM

## 2011-02-23 DIAGNOSIS — Z363 Encounter for antenatal screening for malformations: Secondary | ICD-10-CM | POA: Insufficient documentation

## 2011-02-24 ENCOUNTER — Ambulatory Visit (INDEPENDENT_AMBULATORY_CARE_PROVIDER_SITE_OTHER): Payer: BC Managed Care – PPO | Admitting: Obstetrics & Gynecology

## 2011-02-24 VITALS — BP 146/76 | Temp 97.7°F | Wt 218.0 lb

## 2011-02-24 DIAGNOSIS — Z3400 Encounter for supervision of normal first pregnancy, unspecified trimester: Secondary | ICD-10-CM

## 2011-02-24 NOTE — Progress Notes (Signed)
Regularly scheduled visit. She has no complaints. Her U/s showed 50% growth and all normal. She declines Quad screen today. She has had FM for 1 week. We discussed recommended weight gain and a long talk about cHTN versus PIH, treatment versus watchful waiting (at this point). We have agreed that we will hold off on meds today and recheck BP in 3 weeks. I have recommended a gentle exercise program and limit further weight gain.

## 2011-02-24 NOTE — Progress Notes (Signed)
p-92  Pt is having a boy.

## 2011-02-27 ENCOUNTER — Ambulatory Visit (INDEPENDENT_AMBULATORY_CARE_PROVIDER_SITE_OTHER): Payer: BC Managed Care – PPO | Admitting: Family

## 2011-02-27 DIAGNOSIS — Z3480 Encounter for supervision of other normal pregnancy, unspecified trimester: Secondary | ICD-10-CM

## 2011-02-27 MED ORDER — CYCLOBENZAPRINE HCL 10 MG PO TABS: 10.0000 mg | ORAL_TABLET | Freq: Three times a day (TID) | ORAL | Status: AC | PRN

## 2011-02-27 NOTE — Progress Notes (Signed)
Pt here with report of pulling muscle while at work yesterday turning to get a box.  Denies vaginal bleeding, leaking of fluid, or contractions. +fetal movement.  Tried heat and tylenol with little relief.  Rx flexeril; report if no improvement in symptoms.

## 2011-02-27 NOTE — Progress Notes (Signed)
p-67 Lower back pain.  She feels she has pulled a muscle and has been alternating heat and ice.  Was a little better this morning then started to hurt again.

## 2011-03-17 NOTE — L&D Delivery Note (Signed)
Delivery Note At 11:51 PM a viable female was delivered via  OA presentation.  APGAR:9 9, ; weight 8 lb 4.5 oz (3756 g).   Placenta status: delivered intact, Cord: 3 vessels with the following complications:  none .  Cord pH: 7.3  Anesthesia:  10cc 1% lidocaine  Episiotomy: none Lacerations: vaginal Suture Repair: 3.0 vicryl rapide Est. Blood Loss (mL): 300  Mom to postpartum.  Baby to nursery-stable.  [place] Retana H. 07/14/2011, 12:25 AM

## 2011-03-18 ENCOUNTER — Ambulatory Visit (INDEPENDENT_AMBULATORY_CARE_PROVIDER_SITE_OTHER): Payer: BC Managed Care – PPO | Admitting: Obstetrics & Gynecology

## 2011-03-18 DIAGNOSIS — O169 Unspecified maternal hypertension, unspecified trimester: Secondary | ICD-10-CM | POA: Insufficient documentation

## 2011-03-18 DIAGNOSIS — IMO0002 Reserved for concepts with insufficient information to code with codable children: Secondary | ICD-10-CM

## 2011-03-18 DIAGNOSIS — Z3490 Encounter for supervision of normal pregnancy, unspecified, unspecified trimester: Secondary | ICD-10-CM | POA: Insufficient documentation

## 2011-03-18 DIAGNOSIS — Z111 Encounter for screening for respiratory tuberculosis: Secondary | ICD-10-CM

## 2011-03-18 DIAGNOSIS — N390 Urinary tract infection, site not specified: Secondary | ICD-10-CM

## 2011-03-18 DIAGNOSIS — Z3480 Encounter for supervision of other normal pregnancy, unspecified trimester: Secondary | ICD-10-CM

## 2011-03-18 NOTE — Progress Notes (Signed)
p-57, urine- trace blood, moderate leukocytes

## 2011-03-18 NOTE — Patient Instructions (Signed)
Breastfeeding BENEFITS OF BREASTFEEDING For the baby  The first milk (colostrum) helps the baby's digestive system function better.   There are antibodies from the mother in the milk that help the baby fight off infections.   The baby has a lower incidence of asthma, allergies, and SIDS (sudden infant death syndrome).   The nutrients in breast milk are better than formulas for the baby and helps the baby's brain grow better.   Babies who breastfeed have less gas, colic, and constipation.  For the mother  Breastfeeding helps develop a very special bond between mother and baby.   It is more convenient, always available at the correct temperature and cheaper than formula feeding.   It burns calories in the mother and helps with losing weight that was gained during pregnancy.   It makes the uterus contract back down to normal size faster and slows bleeding following delivery.   Breastfeeding mothers have a lower risk of developing breast cancer.  NURSE FREQUENTLY  A healthy, full-term baby may breastfeed as often as every hour or space his or her feedings to every 3 hours.   How often to nurse will vary from baby to baby. Watch your baby for signs of hunger, not the clock.   Nurse as often as the baby requests, or when you feel the need to reduce the fullness of your breasts.   Awaken the baby if it has been 3 to 4 hours since the last feeding.   Frequent feeding will help the mother make more milk and will prevent problems like sore nipples and engorgement of the breasts.  BABY'S POSITION AT THE BREAST  Whether lying down or sitting, be sure that the baby's tummy is facing your tummy.   Support the breast with 4 fingers underneath the breast and the thumb above. Make sure your fingers are well away from the nipple and baby's mouth.   Stroke the baby's lips and cheek closest to the breast gently with your finger or nipple.   When the baby's mouth is open wide enough, place  all of your nipple and as much of the dark area around the nipple as possible into your baby's mouth.   Pull the baby in close so the tip of the nose and the baby's cheeks touch the breast during the feeding.  FEEDINGS  The length of each feeding varies from baby to baby and from feeding to feeding.   The baby must suck about 2 to 3 minutes for your milk to get to him or her. This is called a "let down." For this reason, allow the baby to feed on each breast as long as he or she wants. Your baby will end the feeding when he or she has received the right balance of nutrients.   To break the suction, put your finger into the corner of the baby's mouth and slide it between his or her gums before removing your breast from his or her mouth. This will help prevent sore nipples.  REDUCING BREAST ENGORGEMENT  In the first week after your baby is born, you may experience signs of breast engorgement. When breasts are engorged, they feel heavy, warm, full, and may be tender to the touch. You can reduce engorgement if you:   Nurse frequently, every 2 to 3 hours. Mothers who breastfeed early and often have fewer problems with engorgement.   Place light ice packs on your breasts between feedings. This reduces swelling. Wrap the ice packs in a   lightweight towel to protect your skin.   Apply moist hot packs to your breast for 5 to 10 minutes before each feeding. This increases circulation and helps the milk flow.   Gently massage your breast before and during the feeding.   Make sure that the baby empties at least one breast at every feeding before switching sides.   Use a breast pump to empty the breasts if your baby is sleepy or not nursing well. You may also want to pump if you are returning to work or or you feel you are getting engorged.   Avoid bottle feeds, pacifiers or supplemental feedings of water or juice in place of breastfeeding.   Be sure the baby is latched on and positioned properly while  breastfeeding.   Prevent fatigue, stress, and anemia.   Wear a supportive bra, avoiding underwire styles.   Eat a balanced diet with enough fluids.  If you follow these suggestions, your engorgement should improve in 24 to 48 hours. If you are still experiencing difficulty, call your lactation consultant or caregiver. IS MY BABY GETTING ENOUGH MILK? Sometimes, mothers worry about whether their babies are getting enough milk. You can be assured that your baby is getting enough milk if:  The baby is actively sucking and you hear swallowing.   The baby nurses at least 8 to 12 times in a 24 hour time period. Nurse your baby until he or she unlatches or falls asleep at the first breast (at least 10 to 20 minutes), then offer the second side.   The baby is wetting 5 to 6 disposable diapers (6 to 8 cloth diapers) in a 24 hour period by 5 to 6 days of age.   The baby is having at least 2 to 3 stools every 24 hours for the first few months. Breast milk is all the food your baby needs. It is not necessary for your baby to have water or formula. In fact, to help your breasts make more milk, it is best not to give your baby supplemental feedings during the early weeks.   The stool should be soft and yellow.   The baby should gain 4 to 7 ounces per week after he is 4 days old.  TAKE CARE OF YOURSELF Take care of your breasts by:  Bathing or showering daily.   Avoiding the use of soaps on your nipples.   Start feedings on your left breast at one feeding and on your right breast at the next feeding.   You will notice an increase in your milk supply 2 to 5 days after delivery. You may feel some discomfort from engorgement, which makes your breasts very firm and often tender. Engorgement "peaks" out within 24 to 48 hours. In the meantime, apply warm moist towels to your breasts for 5 to 10 minutes before feeding. Gentle massage and expression of some milk before feeding will soften your breasts, making  it easier for your baby to latch on. Wear a well fitting nursing bra and air dry your nipples for 10 to 15 minutes after each feeding.   Only use cotton bra pads.   Only use pure lanolin on your nipples after nursing. You do not need to wash it off before nursing.  Take care of yourself by:   Eating well-balanced meals and nutritious snacks.   Drinking milk, fruit juice, and water to satisfy your thirst (about 8 glasses a day).   Getting plenty of rest.   Increasing calcium in   your diet (1200 mg a day).   Avoiding foods that you notice affect the baby in a bad way.  SEEK MEDICAL CARE IF:   You have any questions or difficulty with breastfeeding.   You need help.   You have a hard, red, sore area on your breast, accompanied by a fever of 100.5 F (38.1 C) or more.   Your baby is too sleepy to eat well or is having trouble sleeping.   Your baby is wetting less than 6 diapers per day, by 5 days of age.   Your baby's skin or white part of his or her eyes is more yellow than it was in the hospital.   You feel depressed.  Document Released: 03/02/2005 Document Revised: 11/12/2010 Document Reviewed: 10/15/2008 [employer] Patient Information 2012 [employer]. 

## 2011-03-18 NOTE — Progress Notes (Signed)
Addended by: Granville Lewis on: 03/18/2011 11:04 AM   Modules accepted: Orders

## 2011-03-18 NOTE — Progress Notes (Signed)
Pt has 5 minute pressure when she starts to walk on treadmill.  Cervix = cl / long.

## 2011-03-20 ENCOUNTER — Ambulatory Visit: Payer: BC Managed Care – PPO

## 2011-03-20 LAB — CULTURE, URINE COMPREHENSIVE: Organism ID, Bacteria: NO GROWTH

## 2011-04-15 ENCOUNTER — Ambulatory Visit (INDEPENDENT_AMBULATORY_CARE_PROVIDER_SITE_OTHER): Payer: BC Managed Care – PPO | Admitting: Obstetrics & Gynecology

## 2011-04-15 VITALS — BP 139/72 | Temp 97.1°F | Wt 226.0 lb

## 2011-04-15 DIAGNOSIS — O169 Unspecified maternal hypertension, unspecified trimester: Secondary | ICD-10-CM

## 2011-04-15 DIAGNOSIS — IMO0002 Reserved for concepts with insufficient information to code with codable children: Secondary | ICD-10-CM

## 2011-04-15 DIAGNOSIS — Z3480 Encounter for supervision of other normal pregnancy, unspecified trimester: Secondary | ICD-10-CM

## 2011-04-15 LAB — CBC
Platelets: 232 10*3/uL (ref 150–400)
RBC: 3.66 MIL/uL — ABNORMAL LOW (ref 3.87–5.11)
WBC: 11.5 10*3/uL — ABNORMAL HIGH (ref 4.0–10.5)

## 2011-04-15 NOTE — Progress Notes (Signed)
p-80.  Pt needs to stop sudafed.  Can cause hypertension

## 2011-04-15 NOTE — Progress Notes (Signed)
Pt to stop sudafed.  Pt states BP elevated when she took it at [facility] so doubt white coat hypertension.  Will treat pt as CHTN and start 2x/wk testing at 32 weeks.  Growth [place] at 28 wks. GCT today.

## 2011-04-16 LAB — RPR

## 2011-04-16 LAB — GLUCOSE TOLERANCE, 1 HOUR: Glucose, 1 Hour GTT: 96 mg/dL (ref 70–140)

## 2011-04-16 LAB — HIV ANTIBODY (ROUTINE TESTING W REFLEX): HIV: NONREACTIVE

## 2011-04-23 ENCOUNTER — Ambulatory Visit (HOSPITAL_COMMUNITY)
Admission: RE | Admit: 2011-04-23 | Discharge: 2011-04-23 | Disposition: A | Discharge status: Home / Self Care | Payer: BC Managed Care – PPO | Source: Ambulatory Visit | Attending: Obstetrics & Gynecology | Admitting: Obstetrics & Gynecology

## 2011-04-23 ENCOUNTER — Ambulatory Visit (HOSPITAL_COMMUNITY): Payer: BC Managed Care – PPO

## 2011-04-23 DIAGNOSIS — O169 Unspecified maternal hypertension, unspecified trimester: Secondary | ICD-10-CM

## 2011-04-23 DIAGNOSIS — Z3689 Encounter for other specified antenatal screening: Secondary | ICD-10-CM | POA: Insufficient documentation

## 2011-04-23 DIAGNOSIS — O139 Gestational [pregnancy-induced] hypertension without significant proteinuria, unspecified trimester: Secondary | ICD-10-CM | POA: Insufficient documentation

## 2011-04-29 ENCOUNTER — Ambulatory Visit (INDEPENDENT_AMBULATORY_CARE_PROVIDER_SITE_OTHER): Payer: BC Managed Care – PPO | Admitting: Obstetrics & Gynecology

## 2011-04-29 VITALS — BP 141/80 | Temp 97.0°F | Wt 228.0 lb

## 2011-04-29 DIAGNOSIS — Z3490 Encounter for supervision of normal pregnancy, unspecified, unspecified trimester: Secondary | ICD-10-CM

## 2011-04-29 DIAGNOSIS — Z3480 Encounter for supervision of other normal pregnancy, unspecified trimester: Secondary | ICD-10-CM

## 2011-04-29 NOTE — Progress Notes (Signed)
BP mildly elevated.  Pt is being treated as CHTN.  Growth US WNL on 04/24/11.  Glucola =96.

## 2011-04-29 NOTE — Patient Instructions (Signed)
Breastfeeding BENEFITS OF BREASTFEEDING For the baby  The first milk (colostrum) helps the baby's digestive system function better.   There are antibodies from the mother in the milk that help the baby fight off infections.   The baby has a lower incidence of asthma, allergies, and SIDS (sudden infant death syndrome).   The nutrients in breast milk are better than formulas for the baby and helps the baby's brain grow better.   Babies who breastfeed have less gas, colic, and constipation.  For the mother  Breastfeeding helps develop a very special bond between mother and baby.   It is more convenient, always available at the correct temperature and cheaper than formula feeding.   It burns calories in the mother and helps with losing weight that was gained during pregnancy.   It makes the uterus contract back down to normal size faster and slows bleeding following delivery.   Breastfeeding mothers have a lower risk of developing breast cancer.  NURSE FREQUENTLY  A healthy, full-term baby may breastfeed as often as every hour or space his or her feedings to every 3 hours.   How often to nurse will vary from baby to baby. Watch your baby for signs of hunger, not the clock.   Nurse as often as the baby requests, or when you feel the need to reduce the fullness of your breasts.   Awaken the baby if it has been 3 to 4 hours since the last feeding.   Frequent feeding will help the mother make more milk and will prevent problems like sore nipples and engorgement of the breasts.  BABY'S POSITION AT THE BREAST  Whether lying down or sitting, be sure that the baby's tummy is facing your tummy.   Support the breast with 4 fingers underneath the breast and the thumb above. Make sure your fingers are well away from the nipple and baby's mouth.   Stroke the baby's lips and cheek closest to the breast gently with your finger or nipple.   When the baby's mouth is open wide enough, place  all of your nipple and as much of the dark area around the nipple as possible into your baby's mouth.   Pull the baby in close so the tip of the nose and the baby's cheeks touch the breast during the feeding.  FEEDINGS  The length of each feeding varies from baby to baby and from feeding to feeding.   The baby must suck about 2 to 3 minutes for your milk to get to him or her. This is called a "let down." For this reason, allow the baby to feed on each breast as long as he or she wants. Your baby will end the feeding when he or she has received the right balance of nutrients.   To break the suction, put your finger into the corner of the baby's mouth and slide it between his or her gums before removing your breast from his or her mouth. This will help prevent sore nipples.  REDUCING BREAST ENGORGEMENT  In the first week after your baby is born, you may experience signs of breast engorgement. When breasts are engorged, they feel heavy, warm, full, and may be tender to the touch. You can reduce engorgement if you:   Nurse frequently, every 2 to 3 hours. Mothers who breastfeed early and often have fewer problems with engorgement.   Place light ice packs on your breasts between feedings. This reduces swelling. Wrap the ice packs in a   lightweight towel to protect your skin.   Apply moist hot packs to your breast for 5 to 10 minutes before each feeding. This increases circulation and helps the milk flow.   Gently massage your breast before and during the feeding.   Make sure that the baby empties at least one breast at every feeding before switching sides.   Use a breast pump to empty the breasts if your baby is sleepy or not nursing well. You may also want to pump if you are returning to work or or you feel you are getting engorged.   Avoid bottle feeds, pacifiers or supplemental feedings of water or juice in place of breastfeeding.   Be sure the baby is latched on and positioned properly while  breastfeeding.   Prevent fatigue, stress, and anemia.   Wear a supportive bra, avoiding underwire styles.   Eat a balanced diet with enough fluids.  If you follow these suggestions, your engorgement should improve in 24 to 48 hours. If you are still experiencing difficulty, call your lactation consultant or caregiver. IS MY BABY GETTING ENOUGH MILK? Sometimes, mothers worry about whether their babies are getting enough milk. You can be assured that your baby is getting enough milk if:  The baby is actively sucking and you hear swallowing.   The baby nurses at least 8 to 12 times in a 24 hour time period. Nurse your baby until he or she unlatches or falls asleep at the first breast (at least 10 to 20 minutes), then offer the second side.   The baby is wetting 5 to 6 disposable diapers (6 to 8 cloth diapers) in a 24 hour period by 5 to 6 days of age.   The baby is having at least 2 to 3 stools every 24 hours for the first few months. Breast milk is all the food your baby needs. It is not necessary for your baby to have water or formula. In fact, to help your breasts make more milk, it is best not to give your baby supplemental feedings during the early weeks.   The stool should be soft and yellow.   The baby should gain 4 to 7 ounces per week after he is 4 days old.  TAKE CARE OF YOURSELF Take care of your breasts by:  Bathing or showering daily.   Avoiding the use of soaps on your nipples.   Start feedings on your left breast at one feeding and on your right breast at the next feeding.   You will notice an increase in your milk supply 2 to 5 days after delivery. You may feel some discomfort from engorgement, which makes your breasts very firm and often tender. Engorgement "peaks" out within 24 to 48 hours. In the meantime, apply warm moist towels to your breasts for 5 to 10 minutes before feeding. Gentle massage and expression of some milk before feeding will soften your breasts, making  it easier for your baby to latch on. Wear a well fitting nursing bra and air dry your nipples for 10 to 15 minutes after each feeding.   Only use cotton bra pads.   Only use pure lanolin on your nipples after nursing. You do not need to wash it off before nursing.  Take care of yourself by:   Eating well-balanced meals and nutritious snacks.   Drinking milk, fruit juice, and water to satisfy your thirst (about 8 glasses a day).   Getting plenty of rest.   Increasing calcium in   your diet (1200 mg a day).   Avoiding foods that you notice affect the baby in a bad way.  SEEK MEDICAL CARE IF:   You have any questions or difficulty with breastfeeding.   You need help.   You have a hard, red, sore area on your breast, accompanied by a fever of 100.5 F (38.1 C) or more.   Your baby is too sleepy to eat well or is having trouble sleeping.   Your baby is wetting less than 6 diapers per day, by 5 days of age.   Your baby's skin or white part of his or her eyes is more yellow than it was in the hospital.   You feel depressed.  Document Released: 03/02/2005 Document Revised: 11/12/2010 Document Reviewed: 10/15/2008 [employer] Patient Information 2012 [employer]. 

## 2011-04-29 NOTE — Progress Notes (Signed)
p=95 

## 2011-05-11 ENCOUNTER — Ambulatory Visit: Payer: BC Managed Care – PPO | Admitting: Advanced Practice Midwife

## 2011-05-11 VITALS — BP 120/68 | Temp 98.5°F | Wt 229.0 lb

## 2011-05-11 DIAGNOSIS — Z3400 Encounter for supervision of normal first pregnancy, unspecified trimester: Secondary | ICD-10-CM

## 2011-05-11 NOTE — Patient Instructions (Signed)
[facility]  Pregnancy - Third Trimester The third trimester of pregnancy (the last 3 months) is a period of the most rapid growth for you and your baby. The baby approaches a length of 20 inches and a weight of 6 to 10 pounds. The baby is adding on fat and getting ready for life outside your body. While inside, babies have periods of sleeping and waking, suck their thumbs, and hiccups. You can often feel small contractions of the uterus. This is false labor. It is also called Braxton-Hicks contractions. This is like a practice for labor. The usual problems in this stage of pregnancy include more difficulty breathing, swelling of the hands and feet from water retention, and having to urinate more often because of the uterus and baby pressing on your bladder.  PRENATAL EXAMS  Blood work may continue to be done during prenatal exams. These tests are done to check on your health and the probable health of your baby. Blood work is used to follow your blood levels (hemoglobin). Anemia (low hemoglobin) is common during pregnancy. Iron and vitamins are given to help prevent this. You may also continue to be checked for diabetes. Some of the past blood tests may be done again.   The size of the uterus is measured during each visit. This makes sure your baby is growing properly according to your pregnancy dates.   Your blood pressure is checked every prenatal visit. This is to make sure you are not getting toxemia.   Your urine is checked every prenatal visit for infection, diabetes and protein.   Your weight is checked at each visit. This is done to make sure gains are happening at the suggested rate and that you and your baby are growing normally.   Sometimes, an ultrasound is performed to confirm the position and the proper growth and development of the baby. This is a test done that bounces harmless sound waves off the baby so your caregiver can more accurately determine due dates.   Discuss  the type of pain medication and anesthesia you will have during your labor and delivery.   Discuss the possibility and anesthesia if a Cesarean Section might be necessary.   Inform your caregiver if there is any mental or physical violence at home.  Sometimes, a specialized non-stress test, contraction stress test and biophysical profile are done to make sure the baby is not having a problem. Checking the amniotic fluid surrounding the baby is called an amniocentesis. The amniotic fluid is removed by sticking a needle into the belly (abdomen). This is sometimes done near the end of pregnancy if an early delivery is required. In this case, it is done to help make sure the baby's lungs are mature enough for the baby to live outside of the womb. If the lungs are not mature and it is unsafe to deliver the baby, an injection of cortisone medication is given to the mother 1 to 2 days before the delivery. This helps the baby's lungs mature and makes it safer to deliver the baby. CHANGES OCCURING IN THE THIRD TRIMESTER OF PREGNANCY Your body goes through many changes during pregnancy. They vary from person to person. Talk to your caregiver about changes you notice and are concerned about.  During the last trimester, you have probably had an increase in your appetite. It is normal to have cravings for certain foods. This varies from person to person and pregnancy to pregnancy.   You may begin to get  stretch marks on your hips, abdomen, and breasts. These are normal changes in the body during pregnancy. There are no exercises or medications to take which prevent this change.   Constipation may be treated with a stool softener or adding bulk to your diet. Drinking lots of fluids, fiber in vegetables, fruits, and whole grains are helpful.   Exercising is also helpful. If you have been very active up until your pregnancy, most of these activities can be continued during your pregnancy. If you have been less  active, it is helpful to start an exercise program such as walking. Consult your caregiver before starting exercise programs.   Avoid all smoking, alcohol, un-prescribed drugs, herbs and "street drugs" during your pregnancy. These chemicals affect the formation and growth of the baby. Avoid chemicals throughout the pregnancy to ensure the delivery of a healthy infant.   Backache, varicose veins and hemorrhoids may develop or get worse.   You will tire more easily in the third trimester, which is normal.   The baby's movements may be stronger and more often.   You may become short of breath easily.   Your belly button may stick out.   A yellow discharge may leak from your breasts called colostrum.   You may have a bloody mucus discharge. This usually occurs a few days to a week before labor begins.  HOME CARE INSTRUCTIONS   Keep your caregiver's appointments. Follow your caregiver's instructions regarding medication use, exercise, and diet.   During pregnancy, you are providing food for you and your baby. Continue to eat regular, well-balanced meals. Choose foods such as meat, fish, milk and other low fat dairy products, vegetables, fruits, and whole-grain breads and cereals. Your caregiver will tell you of the ideal weight gain.   A physical sexual relationship may be continued throughout pregnancy if there are no other problems such as early (premature) leaking of amniotic fluid from the membranes, vaginal bleeding, or belly (abdominal) pain.   Exercise regularly if there are no restrictions. Check with your caregiver if you are unsure of the safety of your exercises. Greater weight gain will occur in the last 2 trimesters of pregnancy. Exercising helps:   Control your weight.   Get you in shape for labor and delivery.   You lose weight after you deliver.   Rest a lot with legs elevated, or as needed for leg cramps or low back pain.   Wear a good support or jogging bra for breast  tenderness during pregnancy. This may help if worn during sleep. Pads or tissues may be used in the bra if you are leaking colostrum.   Do not use hot tubs, steam rooms, or saunas.   Wear your seat belt when driving. This protects you and your baby if you are in an accident.   Avoid raw meat, cat litter boxes and soil used by cats. These carry germs that can cause birth defects in the baby.   It is easier to loose urine during pregnancy. Tightening up and strengthening the pelvic muscles will help with this problem. You can practice stopping your urination while you are going to the bathroom. These are the same muscles you need to strengthen. It is also the muscles you would use if you were trying to stop from passing gas. You can practice tightening these muscles up 10 times a set and repeating this about 3 times per day. Once you know what muscles to tighten up, do not perform these exercises during  urination. It is more likely to cause an infection by backing up the urine.   Ask for help if you have financial, counseling or nutritional needs during pregnancy. Your caregiver will be able to offer counseling for these needs as well as refer you for other special needs.   Make a list of emergency phone numbers and have them available.   Plan on getting help from family or friends when you go home from the hospital.   Make a trial run to the hospital.   Take prenatal classes with the father to understand, practice and ask questions about the labor and delivery.   Prepare the baby's room/nursery.   Do not travel out of the city unless it is absolutely necessary and with the advice of your caregiver.   Wear only low or no heal shoes to have better balance and prevent falling.  MEDICATIONS AND DRUG USE IN PREGNANCY  Take prenatal vitamins as directed. The vitamin should contain 1 milligram of folic acid. Keep all vitamins out of reach of children. Only a couple vitamins or tablets containing  iron may be fatal to a baby or young child when ingested.   Avoid use of all medications, including herbs, over-the-counter medications, not prescribed or suggested by your caregiver. Only take over-the-counter or prescription medicines for pain, discomfort, or fever as directed by your caregiver. Do not use aspirin, ibuprofen (Motrin, Advil, Nuprin) or naproxen (Aleve) unless OK'd by your caregiver.   Let your caregiver also know about herbs you may be using.   Alcohol is related to a number of birth defects. This includes fetal alcohol syndrome. All alcohol, in any form, should be avoided completely. Smoking will cause low birth rate and premature babies.   Street/illegal drugs are very harmful to the baby. They are absolutely forbidden. A baby born to an addicted mother will be addicted at birth. The baby will go through the same withdrawal an adult does.  SEEK MEDICAL CARE IF: You have any concerns or worries during your pregnancy. It is better to call with your questions if you feel they cannot wait, rather than worry about them. DECISIONS ABOUT CIRCUMCISION You may or may not know the sex of your baby. If you know your baby is a boy, it may be time to think about circumcision. Circumcision is the removal of the foreskin of the penis. This is the skin that covers the sensitive end of the penis. There is no proven medical need for this. Often this decision is made on what is popular at the time or based upon religious beliefs and social issues. You can discuss these issues with your caregiver or pediatrician. SEEK IMMEDIATE MEDICAL CARE IF:   An unexplained oral temperature above 102 F (38.9 C) develops, or as your caregiver suggests.   You have leaking of fluid from the vagina (birth canal). If leaking membranes are suspected, take your temperature and tell your caregiver of this when you call.   There is vaginal spotting, bleeding or passing clots. Tell your caregiver of the amount and  how many pads are used.   You develop a bad smelling vaginal discharge with a change in the color from clear to white.   You develop vomiting that lasts more than 24 hours.   You develop chills or fever.   You develop shortness of breath.   You develop burning on urination.   You loose more than 2 pounds of weight or gain more than 2 pounds of weight  or as suggested by your caregiver.   You notice sudden swelling of your face, hands, and feet or legs.   You develop belly (abdominal) pain. Round ligament discomfort is a common non-cancerous (benign) cause of abdominal pain in pregnancy. Your caregiver still must evaluate you.   You develop a severe headache that does not go away.   You develop visual problems, blurred or double vision.   If you have not felt your baby move for more than 1 hour. If you think the baby is not moving as much as usual, eat something with sugar in it and lie down on your left side for an hour. The baby should move at least 4 to 5 times per hour. Call right away if your baby moves less than that.   You fall, are in a car accident or any kind of trauma.   There is mental or physical violence at home.  Document Released: 02/24/2001 Document Revised: 11/12/2010 Document Reviewed: 08/29/2008 [facility] Patient Information 2012 [place], [place].

## 2011-05-11 NOTE — Progress Notes (Signed)
p-86  Numbness of Rt upper leg

## 2011-05-25 ENCOUNTER — Ambulatory Visit (INDEPENDENT_AMBULATORY_CARE_PROVIDER_SITE_OTHER): Payer: BC Managed Care – PPO | Admitting: Obstetrics and Gynecology

## 2011-05-25 ENCOUNTER — Encounter: Payer: Self-pay | Admitting: Obstetrics and Gynecology

## 2011-05-25 DIAGNOSIS — O10019 Pre-existing essential hypertension complicating pregnancy, unspecified trimester: Secondary | ICD-10-CM

## 2011-05-25 DIAGNOSIS — Z3400 Encounter for supervision of normal first pregnancy, unspecified trimester: Secondary | ICD-10-CM

## 2011-05-25 DIAGNOSIS — IMO0001 Reserved for inherently not codable concepts without codable children: Secondary | ICD-10-CM

## 2011-05-25 DIAGNOSIS — Z3490 Encounter for supervision of normal pregnancy, unspecified, unspecified trimester: Secondary | ICD-10-CM

## 2011-05-25 NOTE — Progress Notes (Signed)
p-93 

## 2011-05-25 NOTE — Progress Notes (Signed)
States BP was high "once" prepregnnacy and being managed as CHTN so will start FATs today. NST reactive. 28 wk [place] 71st, AFI 17.

## 2011-06-01 ENCOUNTER — Ambulatory Visit (INDEPENDENT_AMBULATORY_CARE_PROVIDER_SITE_OTHER): Payer: BC Managed Care – PPO | Admitting: *Deleted

## 2011-06-01 DIAGNOSIS — O169 Unspecified maternal hypertension, unspecified trimester: Secondary | ICD-10-CM

## 2011-06-01 NOTE — Progress Notes (Signed)
p-89  Several episodes of dizziness yesterday

## 2011-06-10 ENCOUNTER — Ambulatory Visit (INDEPENDENT_AMBULATORY_CARE_PROVIDER_SITE_OTHER): Payer: BC Managed Care – PPO | Admitting: Obstetrics & Gynecology

## 2011-06-10 DIAGNOSIS — O10019 Pre-existing essential hypertension complicating pregnancy, unspecified trimester: Secondary | ICD-10-CM

## 2011-06-10 NOTE — Progress Notes (Signed)
Urine is not clean catch.  Will recollect and see if +1 protein is there.  BP WNL.  Cultures next week.  Continue 2x/week testing for

## 2011-06-10 NOTE — Progress Notes (Signed)
Urine recheck protein trace

## 2011-06-10 NOTE — Progress Notes (Signed)
P-81.  Had one dizzy spell this week, but states it was more of a nausea dizzy than a feeling that she was going to pass out

## 2011-06-17 ENCOUNTER — Ambulatory Visit (INDEPENDENT_AMBULATORY_CARE_PROVIDER_SITE_OTHER): Payer: BC Managed Care – PPO | Admitting: Obstetrics & Gynecology

## 2011-06-17 VITALS — BP 129/75 | Temp 98.0°F | Wt 238.0 lb

## 2011-06-17 DIAGNOSIS — O10019 Pre-existing essential hypertension complicating pregnancy, unspecified trimester: Secondary | ICD-10-CM

## 2011-06-17 DIAGNOSIS — Z3400 Encounter for supervision of normal first pregnancy, unspecified trimester: Secondary | ICD-10-CM

## 2011-06-17 NOTE — Progress Notes (Signed)
Routine visit. No complaints. Good FM, denies CTXs, VB, ROM. Labor precautions given. Cervical cultures/GBS done. NST reactive. Continue 2x/week testing

## 2011-06-18 LAB — GC/CHLAMYDIA PROBE AMP, GENITAL: GC Probe Amp, Genital: NEGATIVE

## 2011-06-22 LAB — STREP B SCREEN, VAGINAL / RECTAL: Strep Gp B Cult/DNA Probe: NEGATIVE

## 2011-06-23 ENCOUNTER — Ambulatory Visit (INDEPENDENT_AMBULATORY_CARE_PROVIDER_SITE_OTHER): Payer: BC Managed Care – PPO | Admitting: Obstetrics & Gynecology

## 2011-06-23 DIAGNOSIS — O169 Unspecified maternal hypertension, unspecified trimester: Secondary | ICD-10-CM

## 2011-06-23 DIAGNOSIS — Z3480 Encounter for supervision of other normal pregnancy, unspecified trimester: Secondary | ICD-10-CM

## 2011-06-23 NOTE — Progress Notes (Signed)
Addended by: Lesly Dukes on: 06/23/2011 05:00 PM   Modules accepted: Orders

## 2011-06-23 NOTE — Progress Notes (Signed)
NST reactive.  Baseline 120.  Good accels, no decels.  Moderate variablility.  Pt is having problems getting off work for 2x/week testing.  Pt has had nml BP for >4 weeks.  Pt would like to do NST once a week.  Pt is aware of 2x weekly testing for HTN, and understands small risk of IUFD in women with HTN.  Plan on induction at 40 weeks or sooner if HTN worsens.  Need to follow up on GBS culture.  It is in computer but not resulted.

## 2011-06-23 NOTE — Patient Instructions (Signed)
Contraception Choices Contraception (birth control) is the use of any methods or devices to prevent pregnancy. Below are some methods to help avoid pregnancy. HORMONAL METHODS   Contraceptive implant. This is a thin, plastic tube containing progesterone hormone. It does not contain estrogen hormone. Your caregiver inserts the tube in the inner part of the upper arm. The tube can remain in place for up to 3 years. After 3 years, the implant must be removed. The implant prevents the ovaries from releasing an egg (ovulation), thickens the cervical mucus which prevents sperm from entering the uterus, and thins the lining of the inside of the uterus.   Progesterone-only injections. These injections are given every 3 months by your caregiver to prevent pregnancy. This synthetic progesterone hormone stops the ovaries from releasing eggs. It also thickens cervical mucus and changes the uterine lining. This makes it harder for sperm to survive in the uterus.   Birth control pills. These pills contain estrogen and progesterone hormone. They work by stopping the egg from forming in the ovary (ovulation). Birth control pills are prescribed by a caregiver.Birth control pills can also be used to treat heavy periods.   Minipill. This type of birth control pill contains only the progesterone hormone. They are taken every day of each month and must be prescribed by your caregiver.   Birth control patch. The patch contains hormones similar to those in birth control pills. It must be changed once a week and is prescribed by a caregiver.   Vaginal ring. The ring contains hormones similar to those in birth control pills. It is left in the vagina for 3 weeks, removed for 1 week, and then a new one is put back in place. The patient must be comfortable inserting and removing the ring from the vagina.A caregiver's prescription is necessary.   Emergency contraception. Emergency contraceptives prevent pregnancy after  unprotected sexual intercourse. This pill can be taken right after sex or up to 5 days after unprotected sex. It is most effective the sooner you take the pills after having sexual intercourse. Emergency contraceptive pills are available without a prescription. Check with your pharmacist. Do not use emergency contraception as your only form of birth control.  BARRIER METHODS   Female condom. This is a thin sheath (latex or rubber) that is worn over the penis during sexual intercourse. It can be used with spermicide to increase effectiveness.   Female condom. This is a soft, loose-fitting sheath that is put into the vagina before sexual intercourse.   Diaphragm. This is a soft, latex, dome-shaped barrier that must be fitted by a caregiver. It is inserted into the vagina, along with a spermicidal jelly. It is inserted before intercourse. The diaphragm should be left in the vagina for 6 to 8 hours after intercourse.   Cervical cap. This is a round, soft, latex or plastic cup that fits over the cervix and must be fitted by a caregiver. The cap can be left in place for up to 48 hours after intercourse.   Sponge. This is a soft, circular piece of polyurethane foam. The sponge has spermicide in it. It is inserted into the vagina after wetting it and before sexual intercourse.   Spermicides. These are chemicals that kill or block sperm from entering the cervix and uterus. They come in the form of creams, jellies, suppositories, foam, or tablets. They do not require a prescription. They are inserted into the vagina with an applicator before having sexual intercourse. The process must be   repeated every time you have sexual intercourse.  INTRAUTERINE CONTRACEPTION  Intrauterine device (IUD). This is a T-shaped device that is put in a woman's uterus during a menstrual period to prevent pregnancy. There are 2 types:   Copper IUD. This type of IUD is wrapped in copper wire and is placed inside the uterus. Copper  makes the uterus and fallopian tubes produce a fluid that kills sperm. It can stay in place for 10 years.   Hormone IUD. This type of IUD contains the hormone progestin (synthetic progesterone). The hormone thickens the cervical mucus and prevents sperm from entering the uterus, and it also thins the uterine lining to prevent implantation of a fertilized egg. The hormone can weaken or kill the sperm that get into the uterus. It can stay in place for 5 years.  PERMANENT METHODS OF CONTRACEPTION  Female tubal ligation. This is when the woman's fallopian tubes are surgically sealed, tied, or blocked to prevent the egg from traveling to the uterus.   Female sterilization. This is when the female has the tubes that carry sperm tied off (vasectomy).This blocks sperm from entering the vagina during sexual intercourse. After the procedure, the man can still ejaculate fluid (semen).  NATURAL PLANNING METHODS  Natural family planning. This is not having sexual intercourse or using a barrier method (condom, diaphragm, cervical cap) on days the woman could become pregnant.   Calendar method. This is keeping track of the length of each menstrual cycle and identifying when you are fertile.   Ovulation method. This is avoiding sexual intercourse during ovulation.   Symptothermal method. This is avoiding sexual intercourse during ovulation, using a thermometer and ovulation symptoms.   Post-ovulation method. This is timing sexual intercourse after you have ovulated.  Regardless of which type or method of contraception you choose, it is important that you use condoms to protect against the transmission of sexually transmitted diseases (STDs). Talk with your caregiver about which form of contraception is most appropriate for you. Document Released: 03/02/2005 Document Revised: 02/19/2011 Document Reviewed: 07/09/2010 [employer] Patient Information 2012 [employer]. 

## 2011-06-23 NOTE — Progress Notes (Signed)
p-88  Rt hand numbness in the morning

## 2011-06-24 ENCOUNTER — Telehealth: Payer: Self-pay | Admitting: *Deleted

## 2011-06-24 NOTE — Telephone Encounter (Signed)
Spoke with [employer] rep who has no record of pt's GBS although we have the requistion in the system.  Pt notified that we will need to repeat the test @ her next visit.

## 2011-06-29 ENCOUNTER — Encounter: Payer: Self-pay | Admitting: Obstetrics & Gynecology

## 2011-06-29 ENCOUNTER — Ambulatory Visit (INDEPENDENT_AMBULATORY_CARE_PROVIDER_SITE_OTHER): Payer: BC Managed Care – PPO | Admitting: Advanced Practice Midwife

## 2011-06-29 VITALS — BP 118/72 | Temp 98.6°F | Wt 242.0 lb

## 2011-06-29 DIAGNOSIS — Z3400 Encounter for supervision of normal first pregnancy, unspecified trimester: Secondary | ICD-10-CM

## 2011-06-29 NOTE — Progress Notes (Signed)
p-84 GBS to be done today

## 2011-06-29 NOTE — Progress Notes (Signed)
Doing well Denies headache or visual changes.  Has 1+ edema of feet/ankles,trace in hands.  Will repeat GBS because of lab error. Cervix 1/75/-2, membranes swept. Discussed IOLat 40 wks.   Pt would prefer not to be induced...risks discussed

## 2011-06-29 NOTE — Patient Instructions (Signed)
Normal Labor and Delivery Your caregiver must first be sure you are in labor. Signs of labor include:  You may pass what is called "the mucus plug" before labor begins. This is a small amount of blood stained mucus.   Regular uterine contractions.   The time between contractions get closer together.   The discomfort and pain gradually gets more intense.   Pains are mostly located in the back.   Pains get worse when walking.   The cervix (the opening of the uterus becomes thinner (begins to efface) and opens up (dilates).  Once you are in labor and admitted into the hospital or care center, your caregiver will do the following:  A complete physical examination.   Check your vital signs (blood pressure, pulse, temperature and the fetal heart rate).   Do a vaginal examination (using a sterile glove and lubricant) to determine:   The position (presentation) of the baby (head [vertex] or buttock first).   The level (station) of the baby's head in the birth canal.   The effacement and dilatation of the cervix.   You may have your pubic hair shaved and be given an enema depending on your caregiver and the circumstance.   An electronic monitor is usually placed on your abdomen. The monitor follows the length and intensity of the contractions, as well as the baby's heart rate.   Usually, your caregiver will insert an IV in your arm with a bottle of sugar water. This is done as a precaution so that medications can be given to you quickly during labor or delivery.  NORMAL LABOR AND DELIVERY IS DIVIDED UP INTO 3 STAGES: First Stage This is when regular contractions begin and the cervix begins to efface and dilate. This stage can last from 3 to 15 hours. The end of the first stage is when the cervix is 100% effaced and 10 centimeters dilated. Pain medications may be given by   Injection (morphine, demerol, etc.)   Regional anesthesia (spinal, caudal or epidural, anesthetics given in  different locations of the spine). Paracervical pain medication may be given, which is an injection of and anesthetic on each side of the cervix.  A pregnant woman may request to have "Natural Childbirth" which is not to have any medications or anesthesia during her labor and delivery. Second Stage This is when the baby comes down through the birth canal (vagina) and is born. This can take 1 to 4 hours. As the baby's head comes down through the birth canal, you may feel like you are going to have a bowel movement. You will get the urge to bear down and push until the baby is delivered. As the baby's head is being delivered, the caregiver will decide if an episiotomy (a cut in the perineum and vagina area) is needed to prevent tearing of the tissue in this area. The episiotomy is sewn up after the delivery of the baby and placenta. Sometimes a mask with nitrous oxide is given for the mother to breath during the delivery of the baby to help if there is too much pain. The end of Stage 2 is when the baby is fully delivered. Then when the umbilical cord stops pulsating it is clamped and cut. Third Stage The third stage begins after the baby is completely delivered and ends after the placenta (afterbirth) is delivered. This usually takes 5 to 30 minutes. After the placenta is delivered, a medication is given either by intravenous or injection to help contract   the uterus and prevent bleeding. The third stage is not painful and pain medication is usually not necessary. If an episiotomy was done, it is repaired at this time. After the delivery, the mother is watched and monitored closely for 1 to 2 hours to make sure there is no postpartum bleeding (hemorrhage). If there is a lot of bleeding, medication is given to contract the uterus and stop the bleeding. Document Released: 12/10/2007 Document Revised: 02/19/2011 Document Reviewed: 12/10/2007 [employer] Patient Information 2012 [employer]. 

## 2011-07-06 ENCOUNTER — Ambulatory Visit (INDEPENDENT_AMBULATORY_CARE_PROVIDER_SITE_OTHER): Payer: BC Managed Care – PPO | Admitting: Advanced Practice Midwife

## 2011-07-06 VITALS — BP 119/70 | Temp 98.3°F | Wt 245.0 lb

## 2011-07-06 DIAGNOSIS — Z3480 Encounter for supervision of other normal pregnancy, unspecified trimester: Secondary | ICD-10-CM

## 2011-07-06 DIAGNOSIS — O10019 Pre-existing essential hypertension complicating pregnancy, unspecified trimester: Secondary | ICD-10-CM

## 2011-07-06 NOTE — Patient Instructions (Signed)
Pregnancy - Third Trimester The third trimester of pregnancy (the last 3 months) is a period of the most rapid growth for you and your baby. The baby approaches a length of 20 inches and a weight of 6 to 10 pounds. The baby is adding on fat and getting ready for life outside your body. While inside, babies have periods of sleeping and waking, suck their thumbs, and hiccups. You can often feel small contractions of the uterus. This is false labor. It is also called Braxton-Hicks contractions. This is like a practice for labor. The usual problems in this stage of pregnancy include more difficulty breathing, swelling of the hands and feet from water retention, and having to urinate more often because of the uterus and baby pressing on your bladder.  PRENATAL EXAMS  Blood work may continue to be done during prenatal exams. These tests are done to check on your health and the probable health of your baby. Blood work is used to follow your blood levels (hemoglobin). Anemia (low hemoglobin) is common during pregnancy. Iron and vitamins are given to help prevent this. You may also continue to be checked for diabetes. Some of the past blood tests may be done again.   The size of the uterus is measured during each visit. This makes sure your baby is growing properly according to your pregnancy dates.   Your blood pressure is checked every prenatal visit. This is to make sure you are not getting toxemia.   Your urine is checked every prenatal visit for infection, diabetes and protein.   Your weight is checked at each visit. This is done to make sure gains are happening at the suggested rate and that you and your baby are growing normally.   Sometimes, an ultrasound is performed to confirm the position and the proper growth and development of the baby. This is a test done that bounces harmless sound waves off the baby so your caregiver can more accurately determine due dates.   Discuss the type of pain  medication and anesthesia you will have during your labor and delivery.   Discuss the possibility and anesthesia if a Cesarean Section might be necessary.   Inform your caregiver if there is any mental or physical violence at home.  Sometimes, a specialized non-stress test, contraction stress test and biophysical profile are done to make sure the baby is not having a problem. Checking the amniotic fluid surrounding the baby is called an amniocentesis. The amniotic fluid is removed by sticking a needle into the belly (abdomen). This is sometimes done near the end of pregnancy if an early delivery is required. In this case, it is done to help make sure the baby's lungs are mature enough for the baby to live outside of the womb. If the lungs are not mature and it is unsafe to deliver the baby, an injection of cortisone medication is given to the mother 1 to 2 days before the delivery. This helps the baby's lungs mature and makes it safer to deliver the baby. CHANGES OCCURING IN THE THIRD TRIMESTER OF PREGNANCY Your body goes through many changes during pregnancy. They vary from person to person. Talk to your caregiver about changes you notice and are concerned about.  During the last trimester, you have probably had an increase in your appetite. It is normal to have cravings for certain foods. This varies from person to person and pregnancy to pregnancy.   You may begin to get stretch marks on your hips,   abdomen, and breasts. These are normal changes in the body during pregnancy. There are no exercises or medications to take which prevent this change.   Constipation may be treated with a stool softener or adding bulk to your diet. Drinking lots of fluids, fiber in vegetables, fruits, and whole grains are helpful.   Exercising is also helpful. If you have been very active up until your pregnancy, most of these activities can be continued during your pregnancy. If you have been less active, it is helpful  to start an exercise program such as walking. Consult your caregiver before starting exercise programs.   Avoid all smoking, alcohol, un-prescribed drugs, herbs and "street drugs" during your pregnancy. These chemicals affect the formation and growth of the baby. Avoid chemicals throughout the pregnancy to ensure the delivery of a healthy infant.   Backache, varicose veins and hemorrhoids may develop or get worse.   You will tire more easily in the third trimester, which is normal.   The baby's movements may be stronger and more often.   You may become short of breath easily.   Your belly button may stick out.   A yellow discharge may leak from your breasts called colostrum.   You may have a bloody mucus discharge. This usually occurs a few days to a week before labor begins.  HOME CARE INSTRUCTIONS   Keep your caregiver's appointments. Follow your caregiver's instructions regarding medication use, exercise, and diet.   During pregnancy, you are providing food for you and your baby. Continue to eat regular, well-balanced meals. Choose foods such as meat, fish, milk and other low fat dairy products, vegetables, fruits, and whole-grain breads and cereals. Your caregiver will tell you of the ideal weight gain.   A physical sexual relationship may be continued throughout pregnancy if there are no other problems such as early (premature) leaking of amniotic fluid from the membranes, vaginal bleeding, or belly (abdominal) pain.   Exercise regularly if there are no restrictions. Check with your caregiver if you are unsure of the safety of your exercises. Greater weight gain will occur in the last 2 trimesters of pregnancy. Exercising helps:   Control your weight.   Get you in shape for labor and delivery.   You lose weight after you deliver.   Rest a lot with legs elevated, or as needed for leg cramps or low back pain.   Wear a good support or jogging bra for breast tenderness during  pregnancy. This may help if worn during sleep. Pads or tissues may be used in the bra if you are leaking colostrum.   Do not use hot tubs, steam rooms, or saunas.   Wear your seat belt when driving. This protects you and your baby if you are in an accident.   Avoid raw meat, cat litter boxes and soil used by cats. These carry germs that can cause birth defects in the baby.   It is easier to loose urine during pregnancy. Tightening up and strengthening the pelvic muscles will help with this problem. You can practice stopping your urination while you are going to the bathroom. These are the same muscles you need to strengthen. It is also the muscles you would use if you were trying to stop from passing gas. You can practice tightening these muscles up 10 times a set and repeating this about 3 times per day. Once you know what muscles to tighten up, do not perform these exercises during urination. It is more likely   to cause an infection by backing up the urine.   Ask for help if you have financial, counseling or nutritional needs during pregnancy. Your caregiver will be able to offer counseling for these needs as well as refer you for other special needs.   Make a list of emergency phone numbers and have them available.   Plan on getting help from family or friends when you go home from the hospital.   Make a trial run to the hospital.   Take prenatal classes with the father to understand, practice and ask questions about the labor and delivery.   Prepare the baby's room/nursery.   Do not travel out of the city unless it is absolutely necessary and with the advice of your caregiver.   Wear only low or no heal shoes to have better balance and prevent falling.  MEDICATIONS AND DRUG USE IN PREGNANCY  Take prenatal vitamins as directed. The vitamin should contain 1 milligram of folic acid. Keep all vitamins out of reach of children. Only a couple vitamins or tablets containing iron may be fatal  to a baby or young child when ingested.   Avoid use of all medications, including herbs, over-the-counter medications, not prescribed or suggested by your caregiver. Only take over-the-counter or prescription medicines for pain, discomfort, or fever as directed by your caregiver. Do not use aspirin, ibuprofen (Motrin, Advil, Nuprin) or naproxen (Aleve) unless OK'd by your caregiver.   Let your caregiver also know about herbs you may be using.   Alcohol is related to a number of birth defects. This includes fetal alcohol syndrome. All alcohol, in any form, should be avoided completely. Smoking will cause low birth rate and premature babies.   Street/illegal drugs are very harmful to the baby. They are absolutely forbidden. A baby born to an addicted mother will be addicted at birth. The baby will go through the same withdrawal an adult does.  SEEK MEDICAL CARE IF: You have any concerns or worries during your pregnancy. It is better to call with your questions if you feel they cannot wait, rather than worry about them. DECISIONS ABOUT CIRCUMCISION You may or may not know the sex of your baby. If you know your baby is a boy, it may be time to think about circumcision. Circumcision is the removal of the foreskin of the penis. This is the skin that covers the sensitive end of the penis. There is no proven medical need for this. Often this decision is made on what is popular at the time or based upon religious beliefs and social issues. You can discuss these issues with your caregiver or pediatrician. SEEK IMMEDIATE MEDICAL CARE IF:   An unexplained oral temperature above 102 F (38.9 C) develops, or as your caregiver suggests.   You have leaking of fluid from the vagina (birth canal). If leaking membranes are suspected, take your temperature and tell your caregiver of this when you call.   There is vaginal spotting, bleeding or passing clots. Tell your caregiver of the amount and how many pads are  used.   You develop a bad smelling vaginal discharge with a change in the color from clear to white.   You develop vomiting that lasts more than 24 hours.   You develop chills or fever.   You develop shortness of breath.   You develop burning on urination.   You loose more than 2 pounds of weight or gain more than 2 pounds of weight or as suggested by your   caregiver.   You notice sudden swelling of your face, hands, and feet or legs.   You develop belly (abdominal) pain. Round ligament discomfort is a common non-cancerous (benign) cause of abdominal pain in pregnancy. Your caregiver still must evaluate you.   You develop a severe headache that does not go away.   You develop visual problems, blurred or double vision.   If you have not felt your baby move for more than 1 hour. If you think the baby is not moving as much as usual, eat something with sugar in it and lie down on your left side for an hour. The baby should move at least 4 to 5 times per hour. Call right away if your baby moves less than that.   You fall, are in a car accident or any kind of trauma.   There is mental or physical violence at home.  Document Released: 02/24/2001 Document Revised: 02/19/2011 Document Reviewed: 08/29/2008 [employer] Patient Information 2012 [employer]. 

## 2011-07-06 NOTE — Progress Notes (Signed)
[id number] 

## 2011-07-06 NOTE — Progress Notes (Signed)
Doing well, no headache/vision change/abd pain. GBS negative at last visit. NST reactive. Pt desires to avoid IOL if possible. Discussed signs of labor and rev'd PIH precautions.

## 2011-07-13 ENCOUNTER — Encounter: Payer: BC Managed Care – PPO | Admitting: Obstetrics and Gynecology

## 2011-07-13 ENCOUNTER — Encounter (HOSPITAL_COMMUNITY): Payer: Self-pay | Admitting: *Deleted

## 2011-07-13 ENCOUNTER — Encounter (HOSPITAL_COMMUNITY): Payer: Self-pay | Admitting: Anesthesiology

## 2011-07-13 ENCOUNTER — Inpatient Hospital Stay (HOSPITAL_COMMUNITY)
Admission: AD | Admit: 2011-07-13 | Discharge: 2011-07-15 | DRG: 373 | Disposition: A | Discharge status: Home / Self Care | Payer: BC Managed Care – PPO | Source: Ambulatory Visit | Attending: Obstetrics & Gynecology | Admitting: Obstetrics & Gynecology

## 2011-07-13 ENCOUNTER — Inpatient Hospital Stay (HOSPITAL_COMMUNITY): Payer: BC Managed Care – PPO | Admitting: Anesthesiology

## 2011-07-13 LAB — CBC
HCT: 36.1 % (ref 36.0–46.0)
Hemoglobin: 11.8 g/dL — ABNORMAL LOW (ref 12.0–15.0)
MCHC: 32.7 g/dL (ref 30.0–36.0)
RBC: 3.97 MIL/uL (ref 3.87–5.11)

## 2011-07-13 MED ORDER — OXYTOCIN 20 UNITS IN LACTATED RINGERS INFUSION - SIMPLE
1.0000 m[IU]/min | INTRAVENOUS | Status: DC
  Administered 2011-07-13: 2 m[IU]/min via INTRAVENOUS

## 2011-07-13 MED ORDER — LACTATED RINGERS IV SOLN
500.0000 mL | INTRAVENOUS | Status: DC | PRN
  Administered 2011-07-13: 1000 mL via INTRAVENOUS

## 2011-07-13 MED ORDER — EPHEDRINE 5 MG/ML INJ: 10.0000 mg | INTRAVENOUS | Status: DC | PRN

## 2011-07-13 MED ORDER — DIPHENHYDRAMINE HCL 50 MG/ML IJ SOLN: 12.5000 mg | INTRAMUSCULAR | Status: DC | PRN

## 2011-07-13 MED ORDER — LIDOCAINE HCL (PF) 1 % IJ SOLN
30.0000 mL | INTRAMUSCULAR | Status: DC | PRN
  Administered 2011-07-14: 10 mL via SUBCUTANEOUS
  Filled 2011-07-13: qty 30

## 2011-07-13 MED ORDER — FLEET ENEMA 7-19 GM/118ML RE ENEM: 1.0000 | ENEMA | RECTAL | Status: DC | PRN

## 2011-07-13 MED ORDER — EPHEDRINE 5 MG/ML INJ
10.0000 mg | INTRAVENOUS | Status: DC | PRN
  Filled 2011-07-13: qty 4

## 2011-07-13 MED ORDER — CITRIC ACID-SODIUM CITRATE 334-500 MG/5ML PO SOLN
30.0000 mL | ORAL | Status: DC | PRN
  Administered 2011-07-13: 30 mL via ORAL
  Filled 2011-07-13: qty 15

## 2011-07-13 MED ORDER — OXYCODONE-ACETAMINOPHEN 5-325 MG PO TABS: 1.0000 | ORAL_TABLET | ORAL | Status: DC | PRN

## 2011-07-13 MED ORDER — LACTATED RINGERS IV SOLN
500.0000 mL | Freq: Once | INTRAVENOUS | Status: AC
  Administered 2011-07-13: 700 mL via INTRAVENOUS

## 2011-07-13 MED ORDER — OXYTOCIN BOLUS FROM INFUSION
500.0000 mL | Freq: Once | INTRAVENOUS | Status: DC
  Filled 2011-07-13: qty 1000
  Filled 2011-07-13: qty 500

## 2011-07-13 MED ORDER — PHENYLEPHRINE 40 MCG/ML (10ML) SYRINGE FOR IV PUSH (FOR BLOOD PRESSURE SUPPORT)
80.0000 ug | PREFILLED_SYRINGE | INTRAVENOUS | Status: DC | PRN
Start: 2011-07-13 — End: 2011-07-14

## 2011-07-13 MED ORDER — FENTANYL 2.5 MCG/ML BUPIVACAINE 1/10 % EPIDURAL INFUSION (WH - ANES)
INTRAMUSCULAR | Status: DC | PRN
  Administered 2011-07-13: 14 mL/h via EPIDURAL

## 2011-07-13 MED ORDER — ONDANSETRON HCL 4 MG/2ML IJ SOLN: 4.0000 mg | Freq: Four times a day (QID) | INTRAMUSCULAR | Status: DC | PRN

## 2011-07-13 MED ORDER — NALBUPHINE HCL 10 MG/ML IJ SOLN
10.0000 mg | INTRAMUSCULAR | Status: DC | PRN
  Filled 2011-07-13: qty 1

## 2011-07-13 MED ORDER — FENTANYL 2.5 MCG/ML BUPIVACAINE 1/10 % EPIDURAL INFUSION (WH - ANES)
14.0000 mL/h | INTRAMUSCULAR | Status: DC
  Administered 2011-07-13 (×3): 14 mL/h via EPIDURAL
  Filled 2011-07-13 (×4): qty 60

## 2011-07-13 MED ORDER — OXYTOCIN 20 UNITS IN LACTATED RINGERS INFUSION - SIMPLE: 125.0000 mL/h | Freq: Once | INTRAVENOUS | Status: DC

## 2011-07-13 MED ORDER — LACTATED RINGERS IV SOLN
INTRAVENOUS | Status: DC
  Administered 2011-07-13: 07:00:00 via INTRAVENOUS
  Administered 2011-07-13: 125 mL/h via INTRAVENOUS

## 2011-07-13 MED ORDER — PHENYLEPHRINE 40 MCG/ML (10ML) SYRINGE FOR IV PUSH (FOR BLOOD PRESSURE SUPPORT)
80.0000 ug | PREFILLED_SYRINGE | INTRAVENOUS | Status: DC | PRN
  Filled 2011-07-13: qty 5

## 2011-07-13 MED ORDER — ACETAMINOPHEN 325 MG PO TABS: 650.0000 mg | ORAL_TABLET | ORAL | Status: DC | PRN

## 2011-07-13 MED ORDER — LIDOCAINE HCL (PF) 1 % IJ SOLN
INTRAMUSCULAR | Status: DC | PRN
  Administered 2011-07-13: 5 mL
  Administered 2011-07-13: 4 mL

## 2011-07-13 MED ORDER — IBUPROFEN 600 MG PO TABS
600.0000 mg | ORAL_TABLET | Freq: Four times a day (QID) | ORAL | Status: DC | PRN
  Administered 2011-07-14: 600 mg via ORAL
  Filled 2011-07-13: qty 1

## 2011-07-13 MED ORDER — FAMOTIDINE 20 MG PO TABS
20.0000 mg | ORAL_TABLET | Freq: Every day | ORAL | Status: DC
  Administered 2011-07-13: 20 mg via ORAL
  Filled 2011-07-13: qty 1

## 2011-07-13 MED ORDER — TERBUTALINE SULFATE 1 MG/ML IJ SOLN
0.2500 mg | Freq: Once | INTRAMUSCULAR | Status: AC | PRN
  Filled 2011-07-13: qty 1

## 2011-07-13 NOTE — Anesthesia Procedure Notes (Signed)
Epidural Patient location during procedure: OB Start time: 07/13/2011 12:29 PM  Staffing Anesthesiologist: Lesleigh Hughson A. Performed by: anesthesiologist   Preanesthetic Checklist Completed: patient identified, site marked, surgical consent, pre-op evaluation, timeout performed, IV checked, risks and benefits discussed and monitors and equipment checked  Epidural Patient position: sitting Prep: site prepped and draped and DuraPrep Patient monitoring: continuous pulse ox and blood pressure Approach: midline Injection technique: LOR air  Needle:  Needle type: Tuohy  Needle gauge: 17 G Needle length: 9 cm Needle insertion depth: 6 cm Catheter type: closed end flexible Catheter size: 19 Gauge Catheter at skin depth: 11 cm Test dose: negative and Other  Assessment Events: blood not aspirated, injection not painful, no injection resistance, negative IV test and no paresthesia  Additional Notes Patient identified. Risks and benefits discussed including failed block, incomplete  Pain control, post dural puncture headache, nerve damage, paralysis, blood pressure Changes, nausea, vomiting, reactions to medications-both toxic and allergic and post Partum back pain. All questions were answered. Patient expressed understanding and wished to proceed. Sterile technique was used throughout procedure. Epidural site was Dressed with sterile barrier dressing. No paresthesias, signs of intravascular injection Or signs of intrathecal spread were encountered.  Patient was more comfortable after the epidural was dosed. Please see RN's note for documentation of vital signs and FHR which are stable.

## 2011-07-13 NOTE — Progress Notes (Signed)
[name] [name] is a 26 y.o. G1P0000 at [redacted]w[redacted]d  Subjective: Pt remains comfortable with epidural  Objective: BP 124/67  Pulse 83  Temp(Src) 98.8 F (37.1 C) (Oral)  Resp 20  Ht 5\' 8"  (1.727 m)  Wt 249 lb (112.946 kg)  BMI 37.86 kg/m2  LMP 10/05/2010   Total I/O In: -  Out: 475 [Urine:475]  FHT: Baseline 120s with moderate variability, + accels, baseline change for 13 min to 100s, variability remained moderate, + scalp stim, returned to baseline of 110s UC:   Irregular, difficult to trace SVE:  Ant rim/0,+1/90 Labs: Lab Results  Component Value Date   WBC 16.2* 07/13/2011   HGB 11.8* 07/13/2011   HCT 36.1 07/13/2011   MCV 90.9 07/13/2011   PLT 207 07/13/2011    Assessment / Plan: Stable maternal fetal status, slow cervical change  Labor: Consider pitocin augmentation if not complete in 1 hour Fetal Wellbeing:  Category II Pain Control:  Epidural Anticipated MOD:  NSVD  Joice Nazario 07/13/2011, 8:57 PM

## 2011-07-13 NOTE — Progress Notes (Signed)
Adriyana Greenbaum is a 26 y.o. G1P0000 in labor Subjective:   Objective: BP 102/51  Pulse 74  Temp(Src) 98.8 F (37.1 C) (Oral)  Resp 18  Ht 5\' 8"  (1.727 m)  Wt 112.946 kg (249 lb)  BMI 37.86 kg/m2  SpO2 100%  LMP 10/05/2010   Total I/O In: -  Out: 475 [Urine:475]  FHT:  FHR: 120 bpm, variability: moderate,  accelerations:  Present,  decelerations:  Present bradycardia to 90 for several minutes.  Maternal resuscitation and pitocin stopped.  Return to baseline with variability. UC:   regular, every 2-4 minutes SVE:  10/100/0  Labs: Lab Results  Component Value Date   WBC 16.2* 07/13/2011   HGB 11.8* 07/13/2011   HCT 36.1 07/13/2011   MCV 90.9 07/13/2011   PLT 207 07/13/2011    Assessment / Plan: Augmentation of labor, progressing well  Labor: now fully dilated, on pitocin Preeclampsia:  no signs or symptoms of toxicity Fetal Wellbeing:  Category II Pain Control:  Epidural I/D:  n/a Anticipated MOD:  NSVD  Neala Miggins H. 07/13/2011, 10:51 PM

## 2011-07-13 NOTE — Progress Notes (Signed)
[name] [name] is a 26 y.o. G1P0000 at [redacted]w[redacted]d  Subjective: Comfortable with epidural, feeling pressure with contractions  Objective: BP 115/67  Pulse 75  Temp(Src) 98 F (36.7 C) (Oral)  Resp 20  Ht 5\' 8"  (1.727 m)  Wt 249 lb (112.946 kg)  BMI 37.86 kg/m2  LMP 10/05/2010      FHT:  FHR: 120 bpm, variability: moderate,  accelerations:  Present,  decelerations:  Present early UC:   q3-4 minutes SVE:   Dilation: 8.5 Effacement (%): 90 Station: 0 Exam by:: Georges Mouse CNM  Labs: Lab Results  Component Value Date   WBC 16.2* 07/13/2011   HGB 11.8* 07/13/2011   HCT 36.1 07/13/2011   MCV 90.9 07/13/2011   PLT 207 07/13/2011    Assessment / Plan: Protracted active phase  Labor: Little progress since last exam 2 hours ago - pt resuming nipple stimulation, will reassess for progress in 1+ hours and start pitocin if no change Preeclampsia:  n/a Fetal Wellbeing:  Category I Pain Control:  Epidural I/D:  n/a Anticipated MOD:  NSVD  Miu Chiong 07/13/2011, 5:16 PM

## 2011-07-13 NOTE — H&P (Signed)
[name] [name] is a 26 y.o. female presenting with SROM with small amount of leakage this morning.  Speculum exam revealed ferning on swab. Sterile gloves used to palpate cervix. 3cm, 0, 80%, bag palpated, vertex.  Maternal Medical History:  Reason for admission: Reason for admission: rupture of membranes.  Contractions: Frequency: irregular.   Duration is approximately 3 minutes.   Perceived severity is moderate.    Fetal activity: Perceived fetal activity is normal.   Last perceived fetal movement was within the past hour.    Prenatal complications: no prenatal complications Prenatal Complications - Diabetes: none.   No vaginal bleeding.  OB History    Grav Para Term Preterm Abortions TAB SAB Ect Mult Living   1 0 0 0 0 0 0 0 0 0      Past Medical History  Diagnosis Date  . Asthma     exercise induced in middle school  . Complication of anesthesia     laughing gas, dreaming episode   Past Surgical History  Procedure Date  . Wisdom tooth extraction 2003   Family History: family history includes COPD in her paternal grandfather; Diabetes in her paternal grandmother; Drug abuse in her maternal grandmother; Heart disease in her maternal grandmother; Hypertension in her paternal grandmother; and Stroke in her paternal grandfather. Social History:  reports that she has never smoked. She has never used smokeless tobacco. She reports that she does not drink alcohol or use illicit drugs.  ROS Pertinent items are noted in HPI.  Dilation: 3 Effacement (%): 80 Station: 0 Exam by:: Dr Rivka Safer Blood pressure 131/76, pulse 80, temperature 98.2 F (36.8 C), temperature source Oral, resp. rate 20, height 5\' 8"  (1.727 m), weight 112.946 kg (249 lb), last menstrual period 10/05/2010. Exam Physical Exam  Prenatal labs: ABO, Rh: O/POS/-- (09/12 1610) Antibody: NEG (09/12 1610) Rubella: 28.3 (09/12 1610) RPR: NON REAC (01/30 0945)  HBsAg: NEGATIVE (09/12 1610)  HIV: NON REACTIVE  (01/30 0945)  GBS:   Negative  Assessment/Plan: 26 y/o G1P0 @ 40.1 with SROM   1. SROM Fern positive Likely a small tear, she still has a large palpable bag. No fevers. Will admit to L+D for expectant management  No HTN noted.  BP Readings from Last 3 Encounters:  07/13/11 131/76  07/06/11 119/70  06/29/11 118/72      Amera Banos MD 07/13/2011, 6:44 AM  Discussed case and plan with Cathie Beams, Midwife.

## 2011-07-13 NOTE — Progress Notes (Signed)
[name] [name] is a 26 y.o. G1P0000 at [redacted]w[redacted]d by admitted for rupture of membranes, labor  Subjective: Pt comfortable with epidural, feeling some pressure   Objective: BP 117/63  Pulse 77  Temp(Src) 98 F (36.7 C) (Oral)  Resp 20  Ht 5\' 8"  (1.727 m)  Wt 249 lb (112.946 kg)  BMI 37.86 kg/m2  LMP 10/05/2010      FHT:  FHR: 130s bpm, variability: moderate,  accelerations:  Present,  decelerations:  Present variable x 1 UC:   irregular, every 5-8 minutes SVE:   Dilation: 4.5 Effacement (%): 90 Station: 0;-1 Exam by:: Paul Dykes RNC  Labs: Lab Results  Component Value Date   WBC 16.2* 07/13/2011   HGB 11.8* 07/13/2011   HCT 36.1 07/13/2011   MCV 90.9 07/13/2011   PLT 207 07/13/2011    Assessment / Plan: Protracted latent phase  Labor: Offered pitocin augmentation, pt declines at this time, will try nipple stimulation for the next hour, if contractions not increased at that time will start pitocin  Fetal Wellbeing:  Category II Pain Control:  Epidural Anticipated MOD:  NSVD  [name] [name] 07/13/2011, 2:17 PM

## 2011-07-13 NOTE — Anesthesia Preprocedure Evaluation (Signed)
Anesthesia Evaluation  Patient identified by MRN, date of birth, ID band Patient awake    Reviewed: Allergy & Precautions, H&P , Patient's Chart, lab work & pertinent test results  Airway Mallampati: III TM Distance: >3 FB Neck ROM: full    Dental No notable dental hx. (+) Teeth Intact   Pulmonary asthma ,  Exercise induced breath sounds clear to auscultation  Pulmonary exam normal       Cardiovascular hypertension, Rhythm:regular Rate:Normal  Borderline   Neuro/Psych negative neurological ROS  negative psych ROS   GI/Hepatic Neg liver ROS, GERD-  Medicated,  Endo/Other  Morbid obesity  Renal/GU negative Renal ROS  negative genitourinary   Musculoskeletal   Abdominal   Peds  Hematology negative hematology ROS (+)   Anesthesia Other Findings   Reproductive/Obstetrics (+) Pregnancy                           Anesthesia Physical Anesthesia Plan  ASA: III  Anesthesia Plan: Epidural   Post-op Pain Management:    Induction:   Airway Management Planned:   Additional Equipment:   Intra-op Plan:   Post-operative Plan:   Informed Consent: I have reviewed the patients History and Physical, chart, labs and discussed the procedure including the risks, benefits and alternatives for the proposed anesthesia with the patient or authorized representative who has indicated his/her understanding and acceptance.     Plan Discussed with: Anesthesiologist  Anesthesia Plan Comments:         Anesthesia Quick Evaluation

## 2011-07-13 NOTE — MAU Note (Signed)
Pt states her water broke at [employer]

## 2011-07-13 NOTE — Progress Notes (Signed)
Subjective: Patient feeling contractions every 3-5 minutes  Objective: BP 138/77  Pulse 86  Temp(Src) 98 F (36.7 C) (Oral)  Resp 20  Ht 5\' 8"  (1.727 m)  Wt 112.946 kg (249 lb)  BMI 37.86 kg/m2  LMP 10/05/2010     FHT:  FHR: 120 bpm, variability: moderate,  accelerations:  Present,  decelerations:  Absent UC:   regular, every 3-5 minutes SVE:   Dilation: 3 Effacement (%): 80 Station: 0 Exam by:: Dr Rivka Safer  Labs: Lab Results  Component Value Date   WBC 16.2* 07/13/2011   HGB 11.8* 07/13/2011   HCT 36.1 07/13/2011   MCV 90.9 07/13/2011   PLT 207 07/13/2011    Assessment / Plan: Continue expectant management.  Will recheck in 2 hrs - will start pitocin if no change.  [name], [name] JEHIEL 07/13/2011, 8:58 AM

## 2011-07-13 NOTE — Progress Notes (Signed)
Patient ID: [name] [name], female   DOB: [birth year], 26 y.o.   MRN: [id number]  S:  Comfortable with contractions       States feels a little pressure  O:  AVSS  Filed Vitals:   07/13/11 2049  BP:   Pulse:   Temp: 98.8 F (37.1 C)  Resp: 20   FHR reactive, good accel with scalp stim UCs every 9 minutes  Cervix Anterior lip/c/-1/vtx with molding  A:  Protracted Active Phase      Hypotonic Uterine Dysfunction  P:  Dr Penne Lash in for consultation       We recommend Pitocin Augementation.  Pt is hesitant, but agrees based on Risks/Benefits that were discussed.        Will start Low Dose 2x2        Anticipate SVD

## 2011-07-14 ENCOUNTER — Encounter (HOSPITAL_COMMUNITY): Payer: Self-pay | Admitting: Obstetrics

## 2011-07-14 MED ORDER — SIMETHICONE 80 MG PO CHEW: 80.0000 mg | CHEWABLE_TABLET | ORAL | Status: DC | PRN

## 2011-07-14 MED ORDER — BENZOCAINE-MENTHOL 20-0.5 % EX AERO: 1.0000 "application " | INHALATION_SPRAY | CUTANEOUS | Status: DC | PRN

## 2011-07-14 MED ORDER — SENNOSIDES-DOCUSATE SODIUM 8.6-50 MG PO TABS
2.0000 | ORAL_TABLET | Freq: Every day | ORAL | Status: DC
  Administered 2011-07-14: 2 via ORAL

## 2011-07-14 MED ORDER — ONDANSETRON HCL 4 MG PO TABS: 4.0000 mg | ORAL_TABLET | ORAL | Status: DC | PRN

## 2011-07-14 MED ORDER — TETANUS-DIPHTH-ACELL PERTUSSIS 5-2.5-18.5 LF-MCG/0.5 IM SUSP: 0.5000 mL | Freq: Once | INTRAMUSCULAR | Status: DC

## 2011-07-14 MED ORDER — DIBUCAINE 1 % RE OINT: 1.0000 "application " | TOPICAL_OINTMENT | RECTAL | Status: DC | PRN

## 2011-07-14 MED ORDER — WITCH HAZEL-GLYCERIN EX PADS: 1.0000 "application " | MEDICATED_PAD | CUTANEOUS | Status: DC | PRN

## 2011-07-14 MED ORDER — ZOLPIDEM TARTRATE 5 MG PO TABS: 5.0000 mg | ORAL_TABLET | Freq: Every evening | ORAL | Status: DC | PRN

## 2011-07-14 MED ORDER — ONDANSETRON HCL 4 MG/2ML IJ SOLN: 4.0000 mg | INTRAMUSCULAR | Status: DC | PRN

## 2011-07-14 MED ORDER — LANOLIN HYDROUS EX OINT: TOPICAL_OINTMENT | CUTANEOUS | Status: DC | PRN

## 2011-07-14 MED ORDER — OXYCODONE-ACETAMINOPHEN 5-325 MG PO TABS: 1.0000 | ORAL_TABLET | ORAL | Status: DC | PRN

## 2011-07-14 MED ORDER — DIPHENHYDRAMINE HCL 25 MG PO CAPS: 25.0000 mg | ORAL_CAPSULE | Freq: Four times a day (QID) | ORAL | Status: DC | PRN

## 2011-07-14 MED ORDER — PRENATAL MULTIVITAMIN CH
1.0000 | ORAL_TABLET | Freq: Every day | ORAL | Status: DC
  Administered 2011-07-14 – 2011-07-15 (×2): 1 via ORAL
  Filled 2011-07-14 (×2): qty 1

## 2011-07-14 MED ORDER — IBUPROFEN 600 MG PO TABS
600.0000 mg | ORAL_TABLET | Freq: Four times a day (QID) | ORAL | Status: DC
  Administered 2011-07-14 – 2011-07-15 (×6): 600 mg via ORAL
  Filled 2011-07-14 (×6): qty 1

## 2011-07-14 NOTE — Progress Notes (Signed)
I have seen and examined this patient and I agree with the above. Breastfeeding going well; desires [place]. Cam Hai 10:39 AM 07/14/2011

## 2011-07-14 NOTE — H&P (Signed)
Pt seen and examined. Agree with above note.  [name] [name] H. 07/14/2011 7:52 PM

## 2011-07-14 NOTE — Anesthesia Postprocedure Evaluation (Signed)
  Anesthesia Post-op Note  Patient: [name] [name]  Procedure(s) Performed: * No procedures listed *  Patient Location: PACU and Mother/Baby  Anesthesia Type: Epidural  Level of Consciousness: awake, alert  and oriented  Airway and Oxygen Therapy: Patient Spontanous Breathing  Post-op Pain: mild  Post-op Assessment: Patient's Cardiovascular Status Stable, Respiratory Function Stable, No signs of Nausea or vomiting, Pain level controlled, No headache, No backache, No residual numbness and No residual motor weakness  Post-op Vital Signs: stable  Complications: No apparent anesthesia complications

## 2011-07-14 NOTE — Anesthesia Postprocedure Evaluation (Signed)
  Anesthesia Post-op Note  Patient: [name] [name]  Procedure(s) Performed: *Lumbar Epidural for L&D*  Patient Location: Mother/Baby  Anesthesia Type: Epidural  Level of Consciousness: awake, alert  and oriented  Airway and Oxygen Therapy: Patient Spontanous Breathing  Post-op Pain: none  Post-op Assessment: Post-op Vital signs reviewed, Patient's Cardiovascular Status Stable, Respiratory Function Stable, Patent Airway, No signs of Nausea or vomiting, Adequate PO intake, Pain level controlled, No headache, No backache, No residual numbness and No residual motor weakness  Post-op Vital Signs: Reviewed and stable  Complications: No apparent anesthesia complications

## 2011-07-14 NOTE — Addendum Note (Signed)
Addendum  created 07/14/11 1703 by Elbert Ewings, CRNA   Modules edited:Charges VN, Notes Section

## 2011-07-14 NOTE — Progress Notes (Signed)
Post Partum Day 1 Subjective: no complaints, up ad lib, voiding and tolerating PO Vaginal bleeding "about the same" Objective: Blood pressure 98/58, pulse 90, temperature 97.7 F (36.5 C), temperature source Oral, resp. rate 20, height 5\' 8"  (1.727 m), weight 112.946 kg (249 lb), last menstrual period 10/05/2010, SpO2 97.00%, unknown if currently breastfeeding.  Physical Exam:  General: alert and cooperative Lochia: appropriate Uterine Fundus: firm Incision: n/a DVT Evaluation: No evidence of DVT seen on physical exam.   Basename 07/13/11 0700  HGB 11.8*  HCT 36.1    Assessment/Plan: Plan for discharge tomorrow and Breastfeeding   LOS: 1 day   [name] Schimming MD 07/14/2011, 7:38 AM

## 2011-07-15 MED ORDER — NORETHINDRONE 0.35 MG PO TABS: 1.0000 | ORAL_TABLET | Freq: Every day | ORAL | Status: DC

## 2011-07-15 MED ORDER — IBUPROFEN 600 MG PO TABS: 600.0000 mg | ORAL_TABLET | Freq: Four times a day (QID) | ORAL | Status: AC

## 2011-07-15 NOTE — Discharge Summary (Signed)
Obstetric Discharge Summary Reason for Admission: rupture of membranes Prenatal Procedures: none Intrapartum Procedures: spontaneous vaginal delivery Postpartum Procedures: none Complications-Operative and Postpartum: vaginal lac Hemoglobin  Date Value Range Status  07/13/2011 11.8* 12.0-15.0 (g/dL) Final     HCT  Date Value Range Status  07/13/2011 36.1  36.0-46.0 (%) Final    Physical Exam:  General: alert, cooperative and no distress Lochia: appropriate Uterine Fundus: firm 3}  Discharge Diagnoses: Term Pregnancy-delivered  Discharge Information: Date: 07/15/2011 Activity: pelvic rest Diet: routine Medications: PNV, Ibuprofen and Micronor Condition: stable Instructions: refer to practice specific booklet Discharge to: home Follow-up Information    Schedule an appointment as soon as possible for a visit with [facility]. (for 4 to 6 weeks postpartum or sooner if needed.)    Contact information:   [address] [place] [address]          Newborn Data: Live born female  Birth Weight: 8 lb 4.5 oz (3756 g) APGAR: 9, 9  Home with mother. Breastfeeding going well. Still need inpt circ- will do prior to d/c. Would like Micronor for PP contraception- begin 2 Sundays after delivery.  Taina Landry 07/15/2011, 8:07 AM

## 2011-07-15 NOTE — Discharge Instructions (Signed)
Vaginal Delivery Care After  Change your pad on each trip to the bathroom.   Wipe gently with toilet paper during your hospital stay. Always wipe from front to back. A spray bottle with warm tap water could also be used or a towelette if available.   Place your soiled pad and toilet paper in a bathroom wastebasket with a plastic bag liner.   During your hospital stay, save any clots. If you pass a clot while on the toilet, do not flush it. Also, if your vaginal flow seems excessive to you, notify nursing personnel.   The first time you get out of bed after delivery, wait for assistance from a nurse. Do not get up alone at any time if you feel weak or dizzy.   Bend and extend your ankles forcefully so that you feel the calves of your legs get hard. Do this 6 times every hour when you are in bed and awake.   Do not sit with one foot under you, dangle your legs over the edge of the bed, or maintain a position that hinders the circulation in your legs.   Many women experience after pains for 2 to 3 days after delivery. These after pains are mild uterine contractions. Ask the nurse for a pain medication if you need something for this. Sometimes breastfeeding stimulates after pains; if you find this to be true, ask for the medication  -  hour before the next feeding.   For you and your infant's protection, do not go beyond the door(s) of the obstetric unit. Do not carry your baby in your arms in the hallway. When taking your baby to and from your room, put your baby in the bassinet and push the bassinet.   Mothers may have their babies in their room as much as they desire.  Document Released: 02/28/2000 Document Revised: 02/19/2011 Document Reviewed: 01/28/2007 [employer] Patient Information 2012 [employer]. 

## 2011-08-05 ENCOUNTER — Encounter: Payer: Self-pay | Admitting: *Deleted

## 2011-08-09 ENCOUNTER — Telehealth (HOSPITAL_COMMUNITY): Payer: Self-pay | Admitting: Lactation Services

## 2011-08-09 NOTE — Telephone Encounter (Signed)
Mom called and left message saying that she is feeling burning sensation in breasts after and between feedings.  Mom denies nipple/areola discoloration.   Unable to get her in to be seen until Fri May 31st.  Mom may call her OB.  Also encouraged her to call us in mornings to see if there have been any  cancellations to be seen earlier.  Mom also given resource of [email or web address] to look up s/sxs of yeast infections and over-the-counter remedies.   Probiotics also recommended.

## 2011-08-13 ENCOUNTER — Ambulatory Visit (HOSPITAL_COMMUNITY)
Admission: RE | Admit: 2011-08-13 | Discharge: 2011-08-13 | Disposition: A | Discharge status: Home / Self Care | Payer: BC Managed Care – PPO | Source: Ambulatory Visit | Attending: Obstetrics and Gynecology | Admitting: Obstetrics and Gynecology

## 2011-08-13 ENCOUNTER — Other Ambulatory Visit: Payer: Self-pay | Admitting: Obstetrics & Gynecology

## 2011-08-13 MED ORDER — FLUCONAZOLE 200 MG PO TABS: ORAL_TABLET | ORAL | Status: DC

## 2011-08-13 NOTE — Progress Notes (Signed)
Adult Lactation Consultation Outpatient Visit Note on 08/13/11  Patient Name: [name] [name]                     ZOXW name: Josey Dettmann Date of Birth: 24-Jul-1985                                   DOB: [birth year] Gestational Age at Delivery: 40 weeks             Birth weight : 8 lbs. 4 oz Type of Delivery: vaginal weeks                       Today's weight: 9 lbs. 8.5 oz.  Breastfeeding History: Frequency of Breastfeeding:   Every 2-2 1/2 hrs   Length of Feeding: 15 mins per breast Voids:   6-8/ 24 hrs Stools:  6 yellow and seedy per 24 hrs.  [name] [name] comes in today with her 52 month old baby, with complaints of nipple soreness, and burning primarily after and between breast feeding during last week.  She also states she has burning pain that shoots up into breasts.  Baby has been solely breast fed since birth.  Baby has gained 24.5 oz in 29 days, and nurses every 2-2.5 hrs.  Mom states he has been more fussy recently, and gassy, popping on and off breast more.  It was noted that he has a thin white coating on his tongue and back of mouth around gums.  He also has a bumpy, red diaper rash around his anus, and below his scrotum.    Both nipples are very pink, but skin intact.  Watched [name] [name].  Gave some tips to her to make sure he isn't pinching and latching too shallow.  When he latched, his top and lower lips were tucked.  [name] [name] does know to un tuck lips as this improves comfort during feeding.  Baby fed an hour prior to appointment, so he only transferred 1 oz.  He did pop on and off the breast during the feeding.  Talked to Mom about how baby's tend to do that due to possible mouth tenderness.  To call her pediatrician for Rx for [name] [name] to treat his thrush.  Impression: both mother and baby need treatment for an overgrowth of yeast on nipples and baby's mouth.  Follow-Up call prn      [name] [name] 08/13/2011, 4:30 PM

## 2011-08-13 NOTE — Progress Notes (Signed)
Called by lactation consultant about diagnosis of ductal yeast.  UP do Date recommends 400 mg on day one then 200 mg daily for 2 weeks.  This [facility] for [facility]

## 2011-08-14 ENCOUNTER — Encounter (HOSPITAL_COMMUNITY): Payer: BC Managed Care – PPO

## 2011-08-24 ENCOUNTER — Ambulatory Visit (INDEPENDENT_AMBULATORY_CARE_PROVIDER_SITE_OTHER): Payer: BC Managed Care – PPO | Admitting: Physician Assistant

## 2011-08-24 ENCOUNTER — Encounter: Payer: Self-pay | Admitting: Physician Assistant

## 2011-08-24 MED ORDER — NORETHINDRONE 0.35 MG PO TABS: 1.0000 | ORAL_TABLET | Freq: Every day | ORAL | Status: DC

## 2011-08-24 MED ORDER — NYSTATIN-TRIAMCINOLONE 100000-0.1 UNIT/GM-% EX OINT: TOPICAL_OINTMENT | Freq: Two times a day (BID) | CUTANEOUS | Status: AC

## 2011-08-24 NOTE — Progress Notes (Signed)
Patient is here for pp visit.  She is breast feeding and doing well.

## 2011-08-24 NOTE — Patient Instructions (Signed)
Contraception Choices Birth control (contraception) can stop pregnancy from happening. Different types of birth control work in different ways. Some can:  Make the mucus in the cervix thick. This makes it hard for sperm to get into the uterus.   Thin the lining of the uterus. This makes it hard for an egg to attach to the wall of the uterus.   Stop the ovaries from releasing an egg.   Block the sperm from reaching the egg.  Certain types of surgery can stop pregnancy from happening. For women, the sugery closes the fallopian tubes (tubal ligation). For men, the surgery stops sperm from releasing during sex (vasectomy). HORMONAL BIRTH CONTROL Hormonal birth control stops pregnancy by putting hormones into your body. Types of birth control include:  A small tube put under the skin of the upper arm (implant). The tube can stay in place for 3 years.   Shots given every 3 months.   Pills taken every day or once after sex (intercourse).   Patches that are changed once a week.   A ring put into the vagina (vaginal ring). The ring is left in place for 3 weeks and removed for 1 week. Then, a new ring is put in the vagina.  BARRIER BIRTH CONTROL  Barrier birth control blocks sperm from reaching the egg. Types of birth control include:   A thin covering worn on the penis (female condom) during sex.   A soft, loose covering put into the vagina (female condom) before sex.   A rubber bowl that sits over the cervix (diaphragm). The bowl must be made for you. The bowl is put into the vagina before sex. The bowl is left in place for 6 to 8 hours after sex.   A small, soft cup that fits over the cervix (cervical cap). The cup must be made for you. The cup can be left in place for 48 hours after sex.   A sponge that is put into the vagina before sex.   A chemical that kills or blocks sperm from getting into the cervix and uterus (spermicide). The chemical may be a cream, jelly, foam, or pill.    INTRAUTERINE (IUD) BIRTH CONTROL  IUD birth control is a small, T-shaped piece of plastic. The plastic is put inside the uterus. There are 2 types of IUD:  Copper IUD. The IUD is covered in copper wire. The copper makes a fluid that kills sperm. It can stay in place for 10 years.   Hormone IUD. The hormone stops pregnancy from happening. It can stay in place for 5 years.  NATURAL FAMILY PLANNING BIRTH CONTROL  Natural family planning means not having sex or using barrier birth control when the woman is fertile. A woman can:  Use a calendar to keep track of when she is fertile.   Use a thermometer to measure her body temperature.  Protect yourself against sexual diseases no matter what type of birth control you use. Talk to your doctor about which type of birth control is best for you. Document Released: 12/28/2008 Document Revised: 02/19/2011 Document Reviewed: 07/09/2010 Exit[employer] Patient Information 2012 [employer]. 

## 2011-08-24 NOTE — Progress Notes (Signed)
  Subjective:     [name] [name] is a 26 y.o. female who presents for a postpartum visit. She is 6 weeks postpartum following a spontaneous vaginal delivery. I have fully reviewed the prenatal and intrapartum course. The delivery was at term gestational weeks. Outcome: spontaneous vaginal delivery. Anesthesia: epidural. Postpartum course has been intraductal yeast. 3 days away from completing 14 days course of diflucan. Baby's course has been complicated only by thrush. Baby is feeding by breast. Bleeding no bleeding. Bowel function is normal. Bladder function is normal. Patient is not sexually active. Contraception method is POPs. Postpartum depression screening: negative.  The following portions of the patient's history were reviewed and updated as appropriate: allergies, current medications, past family history, past medical history, past social history, past surgical history and problem list.  Review of Systems Pertinent items are noted in HPI.   Objective:    BP 118/71  Pulse 82  Ht 5\' 8"  (1.727 m)  Wt 206 lb (93.441 kg)  BMI 31.32 kg/m2  Breastfeeding? Yes  General:  alert, cooperative and no distress   Breasts:  inspection negative, no nipple discharge or bleeding, no masses or nodularity palpable   Vulva:  normal  Vagina: well healed  Cervix:  not examined  Corpus: not examined  Adnexa:  not evaluated  Rectal Exam: Normal rectovaginal exam        Assessment:    6 postpartum exam. Pap smear done at today's visit.   Plan:    1. Contraception: POPs 2. Nystatin powered sent to pharmacy for continued treatment of thrush/yeast 3. Follow up in: 10/2012 for pap or prn probs

## 2011-09-11 ENCOUNTER — Telehealth: Payer: Self-pay | Admitting: *Deleted

## 2011-09-11 ENCOUNTER — Telehealth: Payer: Self-pay | Admitting: Obstetrics & Gynecology

## 2011-09-11 DIAGNOSIS — B3789 Other sites of candidiasis: Secondary | ICD-10-CM

## 2011-09-11 DIAGNOSIS — O9102 Infection of nipple associated with the puerperium: Secondary | ICD-10-CM

## 2011-09-11 DIAGNOSIS — Z79899 Other long term (current) drug therapy: Secondary | ICD-10-CM

## 2011-09-11 MED ORDER — FLUCONAZOLE 150 MG PO TABS: ORAL_TABLET | ORAL | Status: DC

## 2011-09-11 MED ORDER — FLUCONAZOLE 200 MG PO TABS: 200.0000 mg | ORAL_TABLET | Freq: Every day | ORAL | Status: AC

## 2011-09-11 NOTE — Telephone Encounter (Signed)
Pt still having symptoms of ductal yeast.  Pt was just getting better at the end of the 2 weeks of Diflucan.  Symptoms returned when stopped.  Infant no longer has thrush.  Will treat with Diflucan 200 mg po daily for 3 weeks.  Will get CMP to check liver enzymes.  Pt denies fevers, chills, or other symptoms of mastitis.  Pt to call back or come to ED if develops mastitis

## 2011-09-11 NOTE — Telephone Encounter (Signed)
Pt called and stated that still has the breast yeast and the Mycolog was not helping.  RX for Diflucan 150mg  [facility].

## 2011-09-14 ENCOUNTER — Telehealth: Payer: Self-pay | Admitting: *Deleted

## 2011-09-14 ENCOUNTER — Other Ambulatory Visit (INDEPENDENT_AMBULATORY_CARE_PROVIDER_SITE_OTHER): Payer: BC Managed Care – PPO

## 2011-09-14 DIAGNOSIS — B372 Candidiasis of skin and nail: Secondary | ICD-10-CM

## 2011-09-14 NOTE — Telephone Encounter (Signed)
Opened in error

## 2011-09-15 LAB — COMPREHENSIVE METABOLIC PANEL
Alkaline Phosphatase: 73 U/L (ref 39–117)
BUN: 12 mg/dL (ref 6–23)
Creat: 0.94 mg/dL (ref 0.50–1.10)
Glucose, Bld: 86 mg/dL (ref 70–99)
Sodium: 139 mEq/L (ref 135–145)
Total Bilirubin: 0.5 mg/dL (ref 0.3–1.2)

## 2011-10-09 ENCOUNTER — Encounter: Payer: Self-pay | Admitting: Physician Assistant

## 2011-10-09 ENCOUNTER — Ambulatory Visit (INDEPENDENT_AMBULATORY_CARE_PROVIDER_SITE_OTHER): Payer: BC Managed Care – PPO | Admitting: Physician Assistant

## 2011-10-09 VITALS — BP 120/64 | HR 82 | Temp 97.2°F | Resp 16 | Ht 68.0 in | Wt 199.0 lb

## 2011-10-09 DIAGNOSIS — IMO0002 Reserved for concepts with insufficient information to code with codable children: Secondary | ICD-10-CM

## 2011-10-09 DIAGNOSIS — N644 Mastodynia: Secondary | ICD-10-CM

## 2011-10-09 NOTE — Patient Instructions (Signed)
Breastfeeding Challenges Breastfeeding is often the best way to feed your baby. Challenges may discourage you from breastfeeding. But solutions can usually be found to help you. Some babies have conditions that may interfere with or make breastfeeding more difficult. But, in all of the following cases, breastfeeding is still best for a baby's health.  ADVANTAGES OF BREASTFEEDING  Breastfed babies tend to be healthier and less affected by disease.   Breastfed babies may have better brain development and be less likely to be overweight than formulafed babies.   Breastfeeding also benefits the mother. It will give you time to be close to your baby and helps create a strong bond. It also:   Delays the return of your periods.   Stimulates your uterus to contract back to normal.   Helps you lose some of the weight you gained during pregnancy.   Breastfeeding is also cheaper than formula feeding. It also does not require mixing formula, heating bottles, or washing extra dishes.   Breastfeeding mothers have a lower risk of developing breast cancer.   Breastfeeding should be encouraged in women with gestational diabetes and diabetes type I and type II.  BREASTFEEDING CHALLENGES  Breastfeeding involves taking the time to get to know your baby's patterns and responding to his or her cues. Once breastfeeding is well established, feedings usually become more regular and more widely spaced. Some mothers do not nurse their babies because they come across problems early on. If at all possible, begin breastfeeding your baby within an hour after delivery. The first milk you produce is called colostrum. It is packed with nutrients and disease-fighting substances. These will help nourish and protect your baby against infections as he or she grows up.   Some babies are unable to breastfeed because of premature birth and small size along with weakness and difficulty sucking. Sometimes with birth defects of the  mouth (cleft lip or cleft palate) the mother may be able to pump breastmilk to give to her baby. Some digestive problems (breast milk jaundice, galactosemia) may be reasons not to nurse. See a lactation consultant if you have a breast infection or breast abscess, breast cancer or other cancer, previous surgery or radiation treatment, or inadequate milk supply (uncommon).  SOME MOTHERS ARE ADVISED NOT TO BREASTFEED DUE TO HEALTH PROBLEMS SUCH AS:  Serious illnesses.   Severe malnutrition.   Undergoing radiation therapy.   Taking psychiatric medication.   Active herpes lesions on the breast.   Chickenpox or shingles.   Active, untreated tuberculosis.   HIV (human immunodeficiency virus) infection.   Drug or alcohol addiction.   Undergoing radioactive iodine therapy.   Leukemia human cell Type I or II.  BREASTFEEDING SHOULD NOT BE PAINFUL  It is natural for minor problems to arise in first time breastfeeding. Problems you may have and some solutions are as follows:   Nipple soreness may be caused by:   Improper position of baby.   Improper feeding techniques.   Improper nipple care.   For many women, there is no identified cause. A simple change in your baby's position while feeding may relieve nipple soreness. Some breastfeeding mothers report nipple soreness only during the initial adjustment period.   If there is tenderness at first, it should gradually go away as the days go by. Poor latch-on and positioning are common causes of sore nipples. This is usually because the baby is not getting enough of the areola into his or her mouth, and is sucking mostly on the  nipple. The areola is the colored portion around the nipple. In general, nurse early and often. Nurse with the nipple and areola in the baby's mouth, not just the nipple. And feed your baby on demand.   If you have sore nipples and put off feedings because of the pain, this can lead to your breasts becoming overly full.  This may lead to plugged milk ducts in the breast followed by engorgement or even infection of the breasts. If your baby is latched on correctly, he or she should be able to nurse as long as needed without causing any pain. If it hurts, take the baby off of your breast and try again. Ask for help if it is still painful for you.   Check the positioning of your baby's body and the way she latches on and sucks. To minimize soreness, your baby's mouth should be open wide with as much of the areola in his or her mouth as possible. You should find that it feels better right away once the baby is positioned correctly.   Do not delay feedings. Try to relax so your let-down reflex comes easily. You also can hand-express a little milk before beginning the feeding so your baby does not clamp down harder, waiting for the milk to come.   If your nipples are very sore, it may be helpful to change positions each time you nurse. This puts the pressure on a different part of the nipple.   After nursing, you can also express a few drops of milk and gently rub it on your nipples. Human milk has natural healing properties. Let your nipples air-dry after feeding, or wear a soft-cotton shirt.   Wearing a nipple shield during nursing will not relieve sore nipples. They actually can prolong soreness by making it hard for the baby to learn to nurse without the shield.   Avoid wearing bras or clothes that are too tight and put pressure on your nipples. If you wear a bra, get one that offers good support to your breasts.   Change nursing pads often to avoid trapping in moisture. Only use cotton pads.   Avoid using soap, ointments or other chemicals on your nipples. Make sure to avoid products that must be removed before nursing. Washing with clean water is all that is necessary to keep your nipples and breasts clean.   Try rubbing pure lanolin on your nipples after breastfeeding to soothe the pain.   Making sure you get  enough rest, eating healthy foods, and getting enough fluids also can help the healing process. If you have very sore nipples, you can ask your caregiver about using non-aspirin pain relievers.   Another cause of sore nursing is a condition called thrush. This is a fungal infection that can form on your nipples from the milk. Other signs of thrush include itching, flaking and drying skin, tender or pink skin. The infection also can form in the baby's mouth from having contact with your nipples. There it appears as little white spots on the inside of the cheeks, gums, or tongue. It also can appear as a diaper rash on your baby that will not go away by using regular diaper rash ointments. If you have any of these symptoms or think you have thrush, contact your doctor and your baby's doctor, or a Advertising copywriter. A lactation consultant is a breastfeeding Risk analyst. You can get medication for your nipples and for your baby.   If you still have  sore nipples after following the above tips, you may need to see someone who is trained in breastfeeding, like a Advertising copywriter.  ENGORGEMENT Engorgement is a condition after pregnancy, when your breasts feel very hard and painful. You also may have breast swelling, tenderness, warmth, redness, throbbing and flattening of the nipple. Engorgement may cause a low-grade fever. This can be confused with a breast infection. Engorgement is the result of the milk building up. It usually happens during the third to fifth day after birth. This slows circulation. When blood and lymph move through the breasts, fluid from the blood vessels can seep into the breast tissues. All of the following can cause engorgement.  Poor positioning.   Infrequent feedings.   Giving supplementary bottles of water, juice, formula, or breast milk or using a pacifier. All of these cut down on your feeding and may lead to engorgement.   Changing the breastfeeding schedule with  decreasing in feeding.   The baby changes the nursing pattern.   Having a baby with a weak suck who is not able to nurse effectively.   Fatigue, stress, or anemia in the mother.   An overabundant milk supply.   Nipple damage.   Breast abnormalities.  Engorgement can lead to plugged ducts or a breast infection. So it is important to try to prevent it before this happens. If treated properly, engorgement should only last for one to two days.  Minimize engorgement by making sure the baby is latched on and positioned correctly at your breast.   Nurse frequently after birth. Allow the baby to nurse as long as he/she likes, as long as he/she is latched on well and sucking effectively.   In the early days when your milk is coming in, you should awaken a sleepy baby every 2 to 3 hours to breastfeed. Breastfeeding often on the affected side helps to remove the milk and keeps milk moving freely. This prevents overfilling of the breast.   Avoid additional bottles and pacifiers.   Try hand expressing or pumping a little milk to first soften the breast, areola, and nipple before breastfeeding. Or massage the breast before feeding.   Cold compresses in between feedings can help ease pain and swelling.   If you are returning to work, try to pump your milk on the same schedule your baby was fed.   Eat a well balanced diet and drink plenty of fluids.   Use a well-fitting, supportive bra that is not too tight.   If your engorgement lasts for more than two days even after treating it, contact a Advertising copywriter.   Use a breast pump to keep up with your nursing schedule.   Use a breast pump if your baby is not taking enough milk or you feel you may be getting engorged.  PLUGGED DUCTS AND INFECTION Plugged ducts and breast infection (mastitis) are common sources of sore breasts postpartum. It is common for many women to have a plugged duct in the breast at some point if breastfeeding.   A  plugged milk duct feels like a tender, sore, lump in the breast. It is not accompanied by a fever or other symptoms. It happens when a milk duct does not properly drain. Then, pressure builds up behind the plug, and surrounding tissue becomes inflamed. A plugged duct usually only occurs in one breast at a time.   A breast infection (mastitis), on the other hand, is soreness or a lump in the breast that can be accompanied  by a fever and/or flu-like symptoms. You may feel run down or very achy. Some women with a breast infection also have nausea and vomiting. You also may have yellowish discharge from the nipple that looks like colostrum. Or the breasts feel warm or hot to the touch and appear pink or red. A breast infection can occur when other family members have a cold or the flu. Like a plugged duct, it usually only occurs in one breast. It is not always easy to tell the difference between a breast infection and a plugged duct. Both have similar symptoms and can improve within 24 to 48 hours.   Treatment for plugged ducts and breast infections is similar. But some breast infections need to also be treated with an antibiotic.   If mastitis is not treated quickly, it may lead to a breast abscess.   It may help to massage the area, starting behind the sore spot. Use your fingers in a circular motion and massage toward the nipple.   Breastfeed more often on the affected side. This helps loosen the plug, keeps the milk moving freely, and the breast from becoming overly full. Nursing every two hours, both day and night on the affected side first can be helpful.   Getting extra sleep or relaxing with your feet up can help speed healing. Often a plugged duct or breast infection is the first sign that a mother is doing too much and becoming overly tired.   Wear a well-fitting supportive bra that is not too tight, since this can constrict milk ducts.   If you do not feel better within 24 hours of trying  these steps, and you have a fever or your symptoms worsen, call your doctor. You may need an antibiotic. Also, if you have a breast infection in which both breasts look affected, or there is pus or blood in the milk, red streaks near the area, or your symptoms came on severely and suddenly, see your doctor right away.   Even if you need an antibiotic, continuing to breastfeed during treatment is best for both you and your baby. Most antibiotics will not affect your baby through your breast milk.  THRUSH Thrush (yeast) is a fungal infection that can form on your nipples or in your breast because it thrives on milk. The infection forms from an overgrowth of the candida organism. Candida usually exists in our bodies and is kept at healthy levels by the natural bacteria in our bodies. But, when the natural balance of bacteria is upset, candida can overgrow, causing an infection. Some of the things that can cause thrush include:   Having an overly moist environment on your skin or nipples that are sore or cracked.   Taking antibiotics, birth control pills or steroids.   Having a diet that contains large amounts of sugar or foods with yeast.   Having a chronic illness like HIV infection, diabetes, or anemia.  If you have sore nipples that last more than a few days even after you make sure your baby's latch and positioning is correct, or you suddenly get sore nipples after several weeks of unpainful nursing, you could have thrush. Some other signs of thrush include:   Pink, flaky, shiny, itchy or cracked nipples.   Deep pink and blistered nipples.   You also could have shooting pains deep in the breast during or after feedings, or achy breasts.  The infection also can form in your baby's mouth from having contact with your  nipples, and appear as little white spots on the inside of the cheeks, gums, or tongue. It also can appear as a diaper rash (small red dots around a rash) on your baby that will not  go away by using regular diaper rash ointments. Many babies with thrush refuse to nurse, or are gassy or cranky.  Solution:   If you or your baby have any of these symptoms, contact your doctor and your baby's doctor so you both can be correctly diagnosed.   You can get medication for your nipples and for your baby. Medication for a mother is usually an ointment for the nipples. Your baby can be given a liquid medication for his/her mouth, and/or an ointment for any diaper rash.   Change disposable nursing pads often. Wash any towels or clothing that come in contact with the yeast in very hot water (above 122 F or 50 C).   Wear a clean bra every day. Wash your hands often, and wash your baby's hands often, especially if he or she sucks on his/her fingers.   Only use cotton pads.   Boil any pacifiers, bottle nipples, or toys your baby puts in his or her mouth once a day for 20 minutes to kill the thrush. After one week of treatment, discard pacifiers and nipples and buy new ones.   Boil daily for 20 minutes all breast pump parts that touch the milk.   Make sure other family members are free of thrush or other fungal infections. If they have symptoms, get them treatment.  NURSING STRIKE A nursing strike is when your baby has been nursing well for months, then suddenly loses interest in breastfeeding and begins to refuse the breast. A nursing strike can mean several things are happening with your baby and that she or he is trying to communicate with you to let you know that something is wrong. Not all babies will react the same to different situations that can cause a nursing strike. Some will continue to breastfeed without a problem, others may just become fussy at the breast, and others will refuse the breast entirely. Some of the major causes of a nursing strike include:  Mouth pain from teething, a fungal infection, or a cold sore.   An ear infection.   Pain from a certain nursing  position.   Being upset about a long separation from the mother or a major change in routine.   Being interested in other things around him or her.   A cold or stuffy nose that makes breathing difficult.   Reduced milk supply from supplementing with bottles or overuse of a pacifier.   Responding to the mother's strong reaction if the baby has bitten her.   Being upset about hearing arguing or people talking in a harsh voice with other family members while nursing.   Reacting to stress, over-stimulation, or having been repeatedly put off when wanting to nurse.   If your baby is on a nursing strike, it is normal to feel frustrated and upset, especially if your baby is unhappy. It is important not to feel guilty or that you have done something wrong. Your breasts also may become uncomfortable as the milk builds up.  Treatment  Try to express your milk manually or with a breast pump on the same schedule as the baby used to breastfeed to avoid engorgement and plugged ducts.   Try another feeding method temporarily to give your baby your milk, such as a cup, dropper,  or spoon. Keep track of your baby's wet diapers to make sure he/she is getting enough milk (5 to 6 per day).   Keep offering your breast to the baby. If the baby is frustrated, stop and try again later. Try when the baby is sleeping or very sleepy.   Try various breastfeeding positions.   Focus on the baby with all of your attention and comfort him or her with extra touching and cuddling.   Try nursing while rocking and in a quiet room free of distractions.  INVERTED OR LARGE NIPPLES Some women have nipples that naturally are inverted, or that turn inward instead of protruding, or that are flat and do not protrude. Inverted or flat nipples can sometimes make it harder to breastfeed because your baby can have a harder time latching on. But remember that for breastfeeding to work, your baby has to latch on to both the nipple and  the breast, so even inverted nipples can work just fine. Very large nipples can make it hard for the baby to get enough of the areola into his or her mouth to compress the milk ducts and get enough milk.  Know what type of nipples you have before you have your baby, so you can be prepared in case you have a problem getting your baby to latch on correctly.   Talk with a lactation consultant at the hospital or at a breastfeeding clinic for extra help if you have flat, inverted, or very large nipples.   Sometimes a Advertising copywriter can help inverted nipples to be pulled out with a small device before your baby is brought to your breast.   In many cases, inverted nipples will protrude more as the baby starts to latch on and as time passes. The baby's sucking will help.   Flat nipples cause fewer problems than inverted nipples. Good latch-on and positioning are usually enough to ensure that a baby latched to a flat nipple breastfeeds well.   The latch for babies of mothers with very large nipples will improve with time as the baby grows. In some cases, it might take several weeks to get the baby to latch well. A good milk supply helps insure that her baby will get enough milk.  RETURNING TO WORK More and more women are breastfeeding when they return to work because they believe in the benefits of breastfeeding. You can purchase or rent effective breast pumps and storage containers for your milk. Many employers are willing to set up special rooms for mothers who pump. But others are not as educated about the benefits of breastfeeding. Also, many women are not able to take off as much time as they'd like after having their babies and might have to return to work before breastfeeding is well established. After you have your baby, take as much time off as possible. This will help you get your breastfeeding established well and may reduce the number of months you may need to pump your milk while you are at  work.   If you plan to have your baby take a bottle of expressed breast milk while you are at work, you can introduce your baby to a bottle when he or she is around four weeks old. Otherwise, the baby might not accept the bottle later on. Once your baby is comfortable taking a bottle, it is a good idea to have Dad or another family member offer a bottle of pumped breast milk on a regular basis so the  baby stays in practice.   Let your employer or Geophysical data processor know that you plan to continue breastfeeding once you return to work. Before you return to work, or even before you have your baby, start talking with your employer about breastfeeding. Do not be afraid to request a clean and private area where you can pump your milk. If you do not have your own office space, you can ask to use a supervisor's office during certain times. Or you can ask to have a clean, clutter free corner of a storage room. All you need is a chair, a small table, and an outlet if you are using an electric pump. Many electric pumps also can run on batteries and do not require an outlet. You can lock the door and place a small sign on it that asks for some privacy. You can pump your breast milk during lunch or other breaks. You could suggest to your employer that you are willing to make up work time for time spent pumping milk.   After pumping, you can refrigerate your milk, place it in a cooler, or freeze it for the baby to be fed later. Many breast pumps come with carrying cases that have a section to store your milk with ice packs. If you do not have access to a refrigerator, you can leave it at room temperatures for up to 6 hours.   Many employers are not aware of state laws that state they have to allow you to breastfeed at your job. Under these laws, your employer is required to set up a space for you to breastfeed and/or allow paid/unpaid time for breastfeeding employees. To see if your state has a breastfeeding law  for employers, go to https://www.warren.info/ or call us at 1-800-LALECHE (in [place]).  JAUNDICE  Jaundice is a condition that is common in many newborns. It appears as a yellowing of the skin and eyes. It is caused by an excess of bilirubin, a yellow pigment that is a product in the blood. All babies are born with extra red blood cells that undergo a process of being broken down and eliminated from the body. Bilirubin levels in the blood can be high because of:  Higher production of it in a newborn.   Increased ability of the newborn intestine to absorb it.   Limited ability of the newborn liver to handle large amounts of it.  Many cases of jaundice do not need to be treated. Your baby's doctor will carefully monitor your baby's bilirubin levels. Sometimes infants have to be temporarily separated from their mothers to receive special treatment with phototherapy (aiming lights on the baby). In these cases, breastfeeding is encouraged but supplements may also be given to the baby. American Academy of Pediatrics advises against stopping breastfeeding in jaundiced babies and suggests continuing frequent breastfeeding, even during treatment. If your baby is jaundiced or develops jaundice, it is important to discuss with your baby's caregiver all possible treatment options. Share that you do not want to interrupt nursing if this is at all possible.  REFLUX  It is not unusual for babies to spit up after nursing. Usually, babies can spit up and show no other signs of illness. The spitting up disappears as the baby's digestive system matures. As long as the baby has 6 to 8 wet diapers and at least 2 bowel movements in a 24 hour period (under 64 weeks of age), and your baby is gaining weight (at least 4 ounces a week)  you can be assured your baby is getting enough milk.  However, some babies have a condition called gastroesophageal reflux (GER). This happens when the muscle at the opening of the stomach  opens at the wrong times, allowing milk and food to come back up into the the tube in the throat (esophagus). Symptoms of GER can include:   Crying as if in discomfort.   Waking up frequently at night.   Problems swallowing.   Frequent red or sore throat.   Signs of asthma, bronchitis, wheezing or problems breathing.   Projectile vomiting.   Arching of the back as if in severe pain.   Slow weight gain.   Gagging or choking.   Frequent hiccupping or burping.   Severe spitting up, or spitting up after every feeding, or hours after eating.   Refusal to eat.  Many healthy babies might have some of these symptoms and do not have GER. But there are babies who might only have a few of these symptoms and have a severe case of GER. Not all babies with GER spit up or vomit.  Some babies with GER do not have a serious medical problem. But caring for them can be hard since they tend to be very fussy and wake up frequently at night. More severe cases of GER may need to be treated with medication if the baby, in addition to spitting up, also refuses to nurse, gains weight poorly or is losing weight, or has periods of gagging or choking.   If your baby spits up after every feeding and has any of the other symptoms mentioned above, it is best to see his or her doctor for a correct diagnosis. Other than GER, your baby could have another condition that needs treatment. If there are no other signs of illness, he/she could just be sensitive to a food in your diet or a medication he/she is receiving. If your baby has GER, it is important to try to continue to breastfeed since breast milk still is more easily digested than formula. Try smaller, more frequent feedings, thorough burping, and putting the baby in an upright position during and after feedings.  CLEFT PALATE AND CLEFT LIP  Cleft palate and cleft lip are some of the most common birth defects that happen as a baby is developing in the womb. A  cleft, or opening, in either the palate or lip can happen together or separately and both can be corrected through surgery. Both conditions can prevent babies from breastfeeding because a baby cannot form a good seal around the nipple and areola with his or her mouth, or get milk out of the breast well.   Cleft palate can happen on one or both sides of a baby's mouth and be partial or complete. Right after birth, a mother whose baby has a cleft palate can try to breastfeed her baby, and she can start expressing her milk right away to keep up her supply. Even if her baby cannot latch on well to her breast, the baby can be fed breast milk by cup. In some hospitals, babies with cleft palate are fitted with a mouthpiece called an obturator. This fits into the cleft and seals it for easier feeding. The baby should be able to exclusively breastfeed after surgery.   Cleft lip can happen on one or both sides of a baby's lip. But a mother can try different breastfeeding positions and use her thumb or breast to help fill in the opening left  by the lip to form a seal around the breast. With cleft lip repair, breastfeeding may only have to be stopped for a few hours.   If your baby is born with a cleft palate or cleft lip, talk with a lactation consultant in the hospital for assistance as soon as possible. Human milk and early breastfeeding is still best for your baby's health.  TWINS OR MULTIPLES Mothers of twins or multiples might feel overwhelmed with the idea of breastfeeding more than one baby at a time. The benefits of human milk to both these mothers and babies are the same as for all mothers and babies. When breastfeeding twins, your breast milk will increase to the amount the babies will need. You will have to take in more calories and liquids when nursing twins. If the babies are premature and unable to nurse, you can pump your breasts and freeze the milk until the babies are ready to nurse. But mothers of  multiples get even more benefits from breastfeeding:  Their uterus contracts, which is helpful because it has stretched even more to hold more than one baby.   Hormones are released that relax the mother, which is helpful with the added stress of caring for more than one baby.   Eight to ten hours per week are saved because there is no need to prepare formula or bottles and the mother's milk is available right away.   Breastfeeding early and often for a mother of multiples is important to keep up her milk supply. A good latch-on for each baby is important to avoid sore nipples. Many mothers find that it is easier to nurse the babies together rather than separately, and that it gets easier as the babies get older. There are many breastfeeding holds that make it easier to nurse more than one baby at a time. If you are having multiples, talk with a lactation consultant about more ways you can successfully breastfeed your babies.  BREASTFEEDING DURING PREGNANCY While most mothers who are nursing a toddler stop breastfeeding if they find out they are pregnant, it is an individual choice to decide whether to keep breastfeeding during the pregnancy. It is not unsafe for the unborn child if you continue to breastfeed an older child during this time. But, if you are having some problems in your pregnancy such as uterine pain or bleeding, a history of preterm labor or problems gaining weight during pregnancy, your doctor may advise you to wean. Your child also may decide to wean on his or her own because pregnancy changes the amount and flavor of your milk. Some women also choose to wean at this time because they have nipple soreness caused by pregnancy hormones, are nauseous, tire more easily, or find that their growing stomachs make breastfeeding uncomfortable. You will need more calories when you breastfeed while pregnant. Your milk production usually slows down around the fourth month of pregnancy.    BREASTFEEDING AFTER BREAST SURGERY   If you have had breast surgery, including breast implants, you might be worried about whether you will be able to breastfeed. The most important things that affect whether you can produce enough milk for your baby are how your surgery was done and where your incisions are, and the reasons for your surgery. For example, women who have had incisions in the fold under the breasts are less likely to have problems producing milk than women who have had incisions around or across the areola. Incisions around the areola can cut  into milk ducts and nerves, where milk is produced and travels. Women who have had breast surgery to augment breasts that never fully developed may not have enough glands to produce a full milk supply.   If you had breast surgery and are worried about how it will affect breastfeeding, talk with a Advertising copywriter. If you are planning breast surgery and are worried about how it will affect breastfeeding, talk with your surgeon about ways he or she can preserve as much of the breast tissue and milk ducts as possible.  INDUCING LACTATION  Many mothers who adopt want to breastfeed their babies and can do it successfully with some help. It is successful ? to  of the time it is tried. Many will need to supplement their breast milk with donated breast milk or infant formula. But some adoptive mothers can breastfeed exclusively, especially if they have been pregnant before. Lactation is a hormonal response to a physical action. So the stimulation of the baby nursing causes the body to see a need for and produce milk. The more the baby nurses, the more a woman's body will produce milk.   Beginning a combination of hormone treatment several months before the baby is born should help facilitate lactation in many cases.   One thing you can do to prepare is to pump every 3 hours around the clock for two to three weeks before your baby arrives. Or you can  wait until the baby arrives and starts to nurse. Breast milk can be frozen until you are ready to nurse the baby. A supplemental nursing system (SNS) or a lactation aid can help ensure that your baby gets enough nutrition and that your breasts are stimulated to produce milk at the same time.   If you are an adoptive mother who wants to breastfeed, you should see both a Advertising copywriter and your doctor for help in establishing an initial milk supply.  FOR MORE INFORMATION [employer]: www.llli.org Document Released: 08/24/2005 Document Revised: 02/19/2011 Document Reviewed: 11/16/2007 [facility] Patient Information 2012 [place], [place].

## 2011-10-09 NOTE — Progress Notes (Signed)
Chief Complaint:  Breast Pain   [name] [name] is  26 y.o. G1P1001.  No LMP recorded. Patient is not currently having periods (Reason: Other)..  She presents complaining of Breast Pain  Pt presents with on-going complaint of breast pain with nursing. Completed 2nd course of diflucan 1 week ago. States 5 weeks total of oral diflucan, continues to use Nystatin cream. Reports "deep shooting pains". Also reports vaginal pain with attempts at intercourse. States "feels tight and sharp stretching pains with attempts at penetration".   Obstetrical/Gynecological History: OB History    Grav Para Term Preterm Abortions TAB SAB Ect Mult Living   1 1 1  0 0 0 0 0 0 1      Past Medical History: Past Medical History  Diagnosis Date  . Asthma     exercise induced in middle school  . Complication of anesthesia     laughing gas, dreaming episode    Past Surgical History: Past Surgical History  Procedure Date  . Wisdom tooth extraction 2003    Family History: Family History  Problem Relation Age of Onset  . Drug abuse Maternal Grandmother   . Heart disease Maternal Grandmother   . Diabetes Paternal Grandmother   . Hypertension Paternal Grandmother   . Stroke Paternal Grandfather   . COPD Paternal Grandfather     Social History: History  Substance Use Topics  . Smoking status: Never Smoker   . Smokeless tobacco: Never Used  . Alcohol Use: No    Allergies: No Known Allergies   Review of Systems - Negative except what has been reviewed in HPI.  Denies fever, chills, engorgement or bodyaches.  Physical Exam   Blood pressure 120/64, pulse 82, temperature 97.2 F (36.2 C), temperature source Oral, resp. rate 16, height 5\' 8"  (1.727 m), weight 199 lb (90.266 kg), currently breastfeeding.  General: General appearance - alert, well appearing, and in no distress, oriented to person, place, and time and overweight Mental status - alert, oriented to person, place, and time, normal  mood, behavior, speech, dress, motor activity, and thought processes, affect appropriate to mood Breasts - both breat normal without s/s of engorgment, mastitis or yeast. BL nipples slightly erythematous without lesions, discharge or exudate. Non tender to palp Focused Gynecological Exam: Well healed perineum. Slight defect of repair noted below introitus, no pain with palpation. Introitus noted to have slight thickening/band. Vaginal muscular   Assessment: Breat pain with nursing Dysparunia   Plan: Discussed with patient more than adequate treatment for intraductal yeast. Reviewed normal sensations/discomforts with let down. Recommend continued nystatin use and lanolin for nipple conditioning. Recommend use of nipple shields to decrease friction to nipples. If no improvement in 2-3 weeks recommend referral to breast surgeon for second opinion.   Reviewed need for extra lubricant with intimacy and perineal massage with olive oil as part of foreplay to soften band at introitus.  RTC next week for lactation consult with Lora, prn problems or worsening of symptoms.  Shanira Tine E. 10/09/2011,11:24 AM

## 2011-10-16 ENCOUNTER — Ambulatory Visit: Payer: BC Managed Care – PPO

## 2012-08-12 IMAGING — US US OB DETAIL+14 WK
2 series · 12 of 28 positions shown · non-contrast
Comparison: none

[Series 1: us ob detail +14 wk · 3 of 15 slices shown (1 of 2)]
[im 3/15]
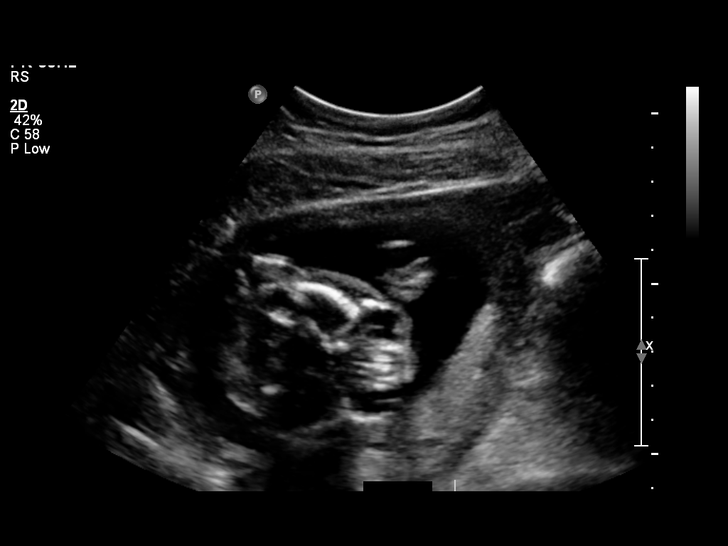
[im 8/15]
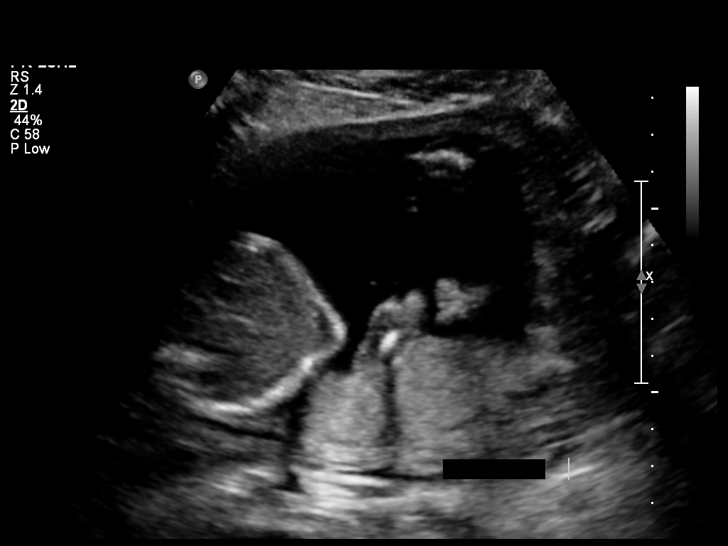
[im 12/15]
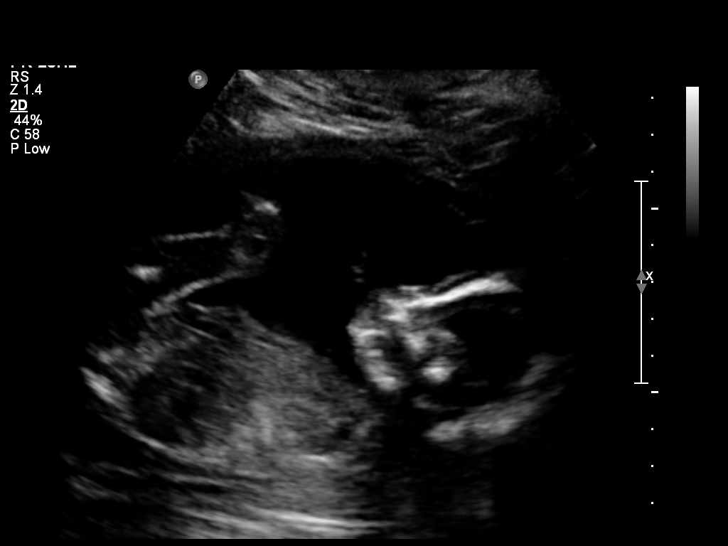

[Series 1: us ob detail +14 wk · 43 acquisitions, 9 frames shown (2 of 2)]
[im 3/43]
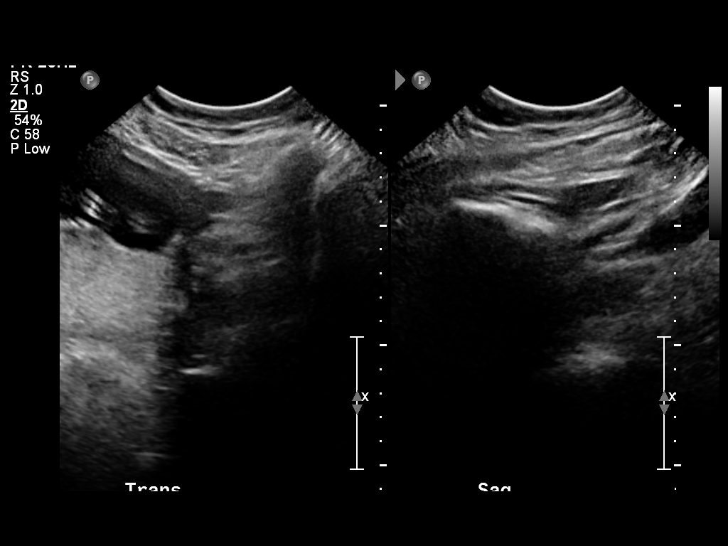
[im 7/43]
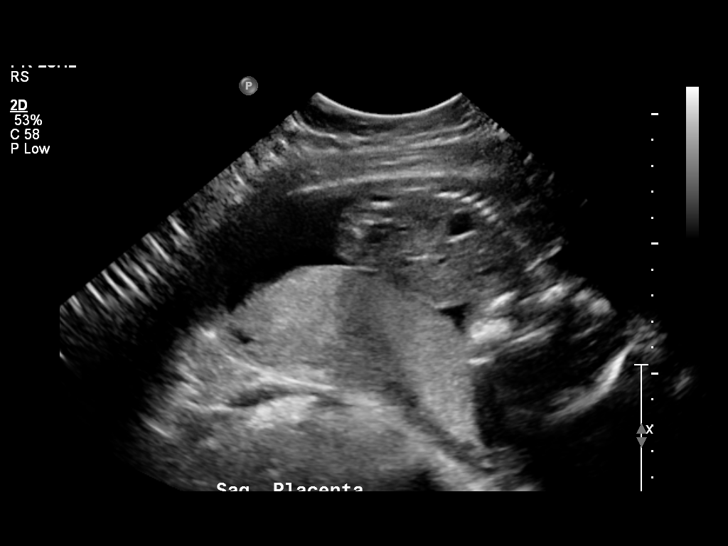
[im 11/43]
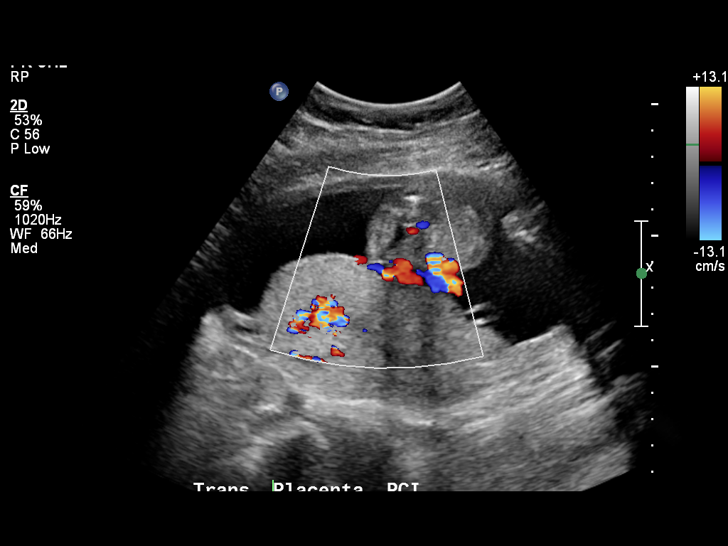
[im 17/43]
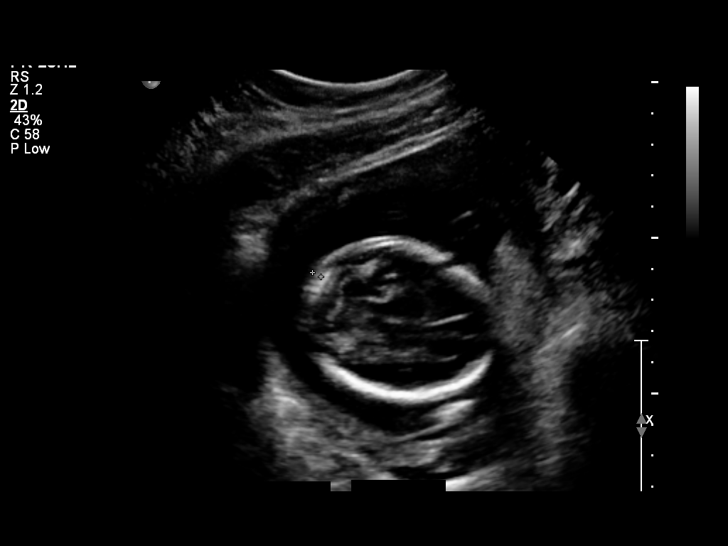
[im 22/43]
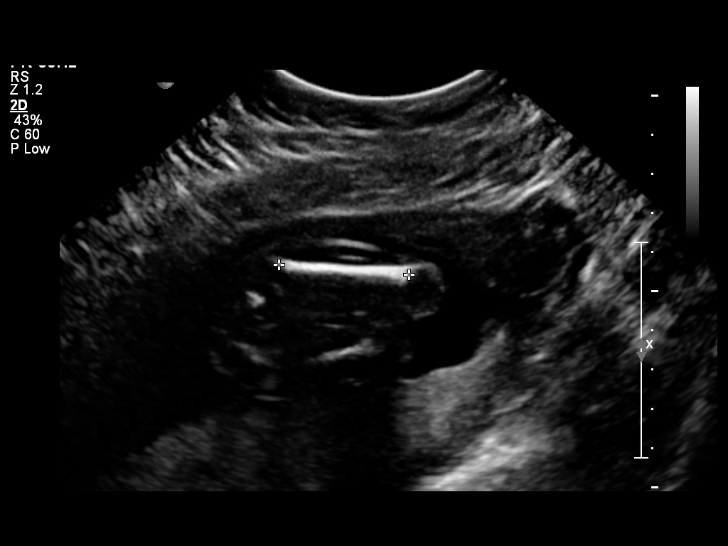
[im 26/43]
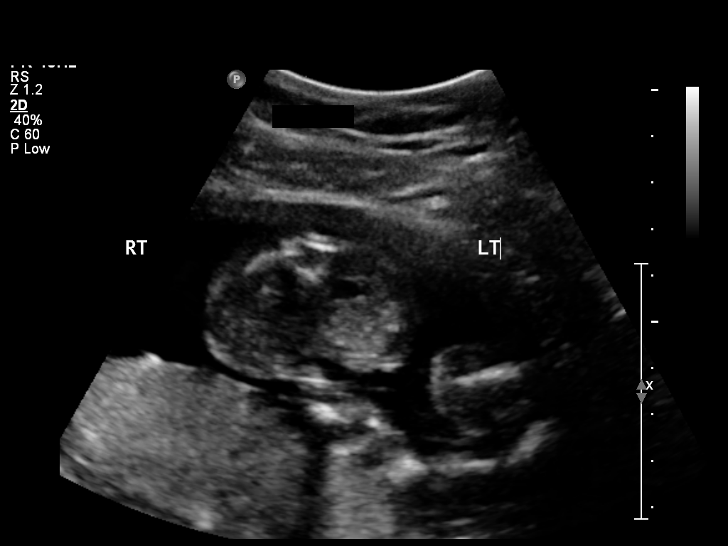
[im 32/43]
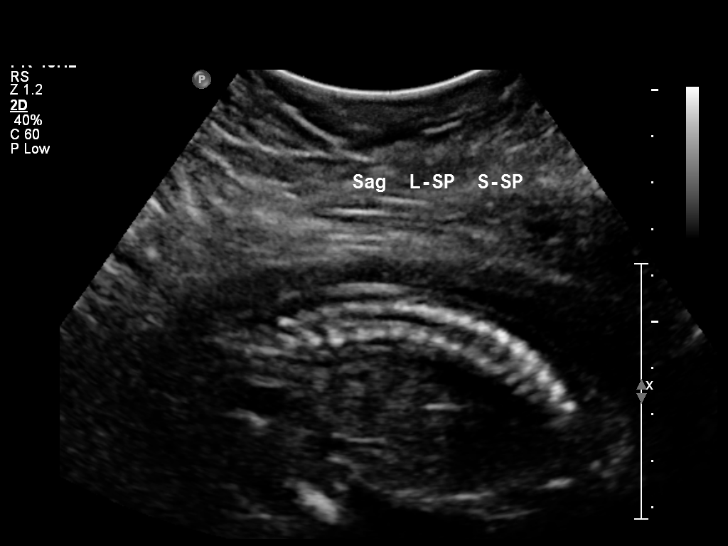
[im 36/43]
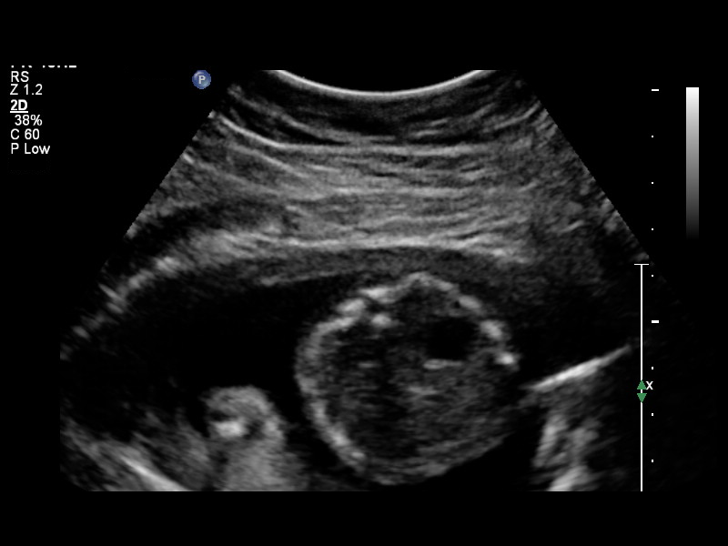
[im 40/43]
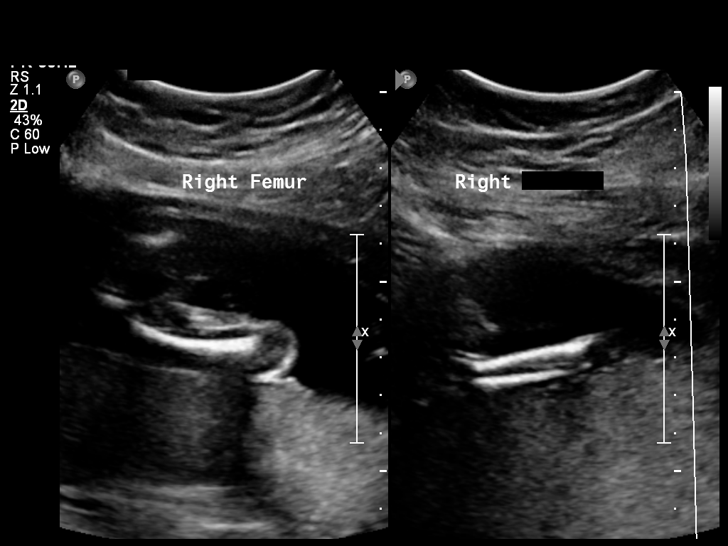

[12 of 28 positions shown; findings below may reference images not displayed]

OBSTETRICS REPORT
                      (Signed Final 02/23/2011 [DATE])

 Order#:         [id number]
Procedures

 US OB DETAIL + 14 WK                                  76811.0
Indications

 Detailed fetal anatomic survey
Fetal Evaluation

 Fetal Heart Rate:  146                         bpm
 Cardiac Activity:  Observed
 Presentation:      Cephalic
 Placenta:          Posterior, above cervical
                    os
 P. Cord            Visualized
 Insertion:

 Amniotic Fluid
 AFI FV:      Subjectively within normal limits
                                             Larg Pckt:     4.9  cm
Biometry

 BPD:     46.6  mm    G. Age:   20w 1d                CI:        67.84   70 - 86
                                                      FL/HC:      18.3   16.8 -

 HC:       181  mm    G. Age:   20w 4d       59  %    HC/AC:      1.21   1.09 -

 AC:     149.7  mm    G. Age:   20w 1d       47  %    FL/BPD:
 FL:      33.1  mm    G. Age:   20w 2d       50  %    FL/AC:      22.1   20 - 24
 HUM:     31.3  mm    G. Age:   20w 3d       60  %
 NFT:     3.06  mm

 Est. FW:     345  gm    0 lb 12 oz      51  %
Gestational Age

 LMP:           20w 1d       Date:   10/05/10                 EDD:   07/12/11
 U/S Today:     20w 2d                                        EDD:   07/11/11
 Best:          20w 1d    Det. By:   LMP  (10/05/10)          EDD:   07/12/11
Genetic Sonogram - Trisomy 21 Screening
 Age:                                             26          Risk=1:   769
 Echogenic bowel:                                 No          LR :
 Choroid plexus cysts:                            No
 Structural anomalies (inc. cardiac):             No          LR :
 Short femur:                                     No          LR :
 Short humerus:                                   No          LR :
 2-vessel umbilical cord:                         No
 Pyelectasis:                                     No          LR :
 Echogenic cardiac foci:                          No          LR :
 Nuchal fold thickening >= 6 mm:                  No          LR :

 9 Of 9 Criteria Were Visualized and 0 Abnormal(s) Were Seen.
 Ultrasound Modified Risk for Fetal Down Syndrome = [DATE]
Anatomy

 Cranium:           Appears normal      Aortic Arch:       Appears normal
 Fetal Cavum:       Appears normal      Ductal Arch:       Appears normal
 Ventricles:        Appears normal      Diaphragm:         Appears normal
 Choroid Plexus:    Appears normal      Stomach:           Appears
                                                           normal, left
                                                           sided
 Cerebellum:        Appears normal      Abdomen:           Appears normal
 Posterior Fossa:   Appears normal      Abdominal Wall:    Appears nml
                                                           (cord insert,
                                                           abd wall)
 Nuchal Fold:       Appears normal      Cord Vessels:      Appears normal
                    (neck, nuchal                          (3 vessel cord)
                    fold)
 Face:              Lips and orbits     Kidneys:           Appear normal
                    appear normal
 Heart:             Appears normal      Bladder:           Appears normal
                    (4 chamber &
                    axis)
 RVOT:              Appears normal      Spine:             Appears normal
 LVOT:              Appears normal      Limbs:             Four extremities
                                                           seen

 Other:     Male gender. Heels visualized.
Cervix Uterus Adnexa

 Cervical Length:   3.2       cm

 Cervix:       Closed.

 Adnexa:     No abnormality visualized.
Impression

 Single live IUP in cephalic presentation.  Concordant
 measurements/assigned GA by LMP.
 No anatomic abnormality seen with a good quality survey
 possible.

 questions or concerns.

## 2012-11-17 ENCOUNTER — Ambulatory Visit (INDEPENDENT_AMBULATORY_CARE_PROVIDER_SITE_OTHER): Payer: No Typology Code available for payment source | Admitting: Obstetrics & Gynecology

## 2012-11-17 ENCOUNTER — Encounter: Payer: Self-pay | Admitting: Obstetrics & Gynecology

## 2012-11-17 VITALS — BP 126/79 | Wt 198.0 lb

## 2012-11-17 DIAGNOSIS — Z3490 Encounter for supervision of normal pregnancy, unspecified, unspecified trimester: Secondary | ICD-10-CM | POA: Insufficient documentation

## 2012-11-17 DIAGNOSIS — Z113 Encounter for screening for infections with a predominantly sexual mode of transmission: Secondary | ICD-10-CM

## 2012-11-17 DIAGNOSIS — O210 Mild hyperemesis gravidarum: Secondary | ICD-10-CM

## 2012-11-17 DIAGNOSIS — Z1151 Encounter for screening for human papillomavirus (HPV): Secondary | ICD-10-CM

## 2012-11-17 DIAGNOSIS — Z3491 Encounter for supervision of normal pregnancy, unspecified, first trimester: Secondary | ICD-10-CM

## 2012-11-17 DIAGNOSIS — Z3480 Encounter for supervision of other normal pregnancy, unspecified trimester: Secondary | ICD-10-CM

## 2012-11-17 DIAGNOSIS — Z3481 Encounter for supervision of other normal pregnancy, first trimester: Secondary | ICD-10-CM

## 2012-11-17 DIAGNOSIS — O219 Vomiting of pregnancy, unspecified: Secondary | ICD-10-CM

## 2012-11-17 DIAGNOSIS — Z124 Encounter for screening for malignant neoplasm of cervix: Secondary | ICD-10-CM

## 2012-11-17 MED ORDER — DOXYLAMINE-PYRIDOXINE 10-10 MG PO TBEC: 2.0000 | DELAYED_RELEASE_TABLET | ORAL | Status: DC

## 2012-11-17 MED ORDER — ONDANSETRON HCL 4 MG PO TABS: 4.0000 mg | ORAL_TABLET | Freq: Three times a day (TID) | ORAL | Status: DC | PRN

## 2012-11-17 NOTE — Progress Notes (Signed)
p74   Concieved 1 st of attempting pregnancy.  She has a son 16 months who delivered with our practice.  Bedside U/S showed IUP with FHT of 169 and CRL 16.80mm

## 2012-11-17 NOTE — Progress Notes (Signed)
   Subjective:    [name] [name] is a G2P1001 [redacted]w[redacted]d being seen today for her first obstetrical visit.  Her obstetrical history is significant for thrush during breast feeding.. Patient does intend to breast feed. Pregnancy history fully reviewed.  Patient reports heartburn and nausea.  Filed Vitals:   11/17/12 1519  BP: 126/79  Weight: 198 lb (89.812 kg)    HISTORY: OB History  Gravida Para Term Preterm AB SAB TAB Ectopic Multiple Living  2 1 1  0 0 0 0 0 0 1    # Outcome Date GA Lbr Len/2nd Weight Sex Delivery Anes PTL Lv  2 CUR           1 TRM 07/13/11 [redacted]w[redacted]d 16:48 / 01:33 8 lb 4.5 oz (3.756 kg) M SVD EPI  Y     Past Medical History  Diagnosis Date  . Asthma     exercise induced in middle school  . Complication of anesthesia     laughing gas, dreaming episode  . GERD (gastroesophageal reflux disease)    Past Surgical History  Procedure Laterality Date  . Wisdom tooth extraction  2003   Family History  Problem Relation Age of Onset  . Drug abuse Maternal Grandmother   . Heart disease Maternal Grandmother   . Diabetes Paternal Grandmother   . Hypertension Paternal Grandmother   . Stroke Paternal Grandfather   . COPD Paternal Grandfather      Exam    Uterus:     Pelvic Exam:    Perineum: No Hemorrhoids   Vulva: normal   Vagina:  normal mucosa, normal discharge   pH: N/a    Cervix: no bleeding following Pap and no lesions   Adnexa: normal adnexa   Bony Pelvis: average  System: Breast:  normal appearance, no masses or tenderness   Skin: normal coloration and turgor, no rashes    Neurologic: oriented, normal mood   Extremities: no deformities, no erythema, induration, or nodules   HEENT sclera clear, anicteric, oropharynx clear, no lesions, neck supple with midline trachea and thyroid without masses   Mouth/Teeth mucous membranes moist, pharynx normal without lesions and dental hygiene good   Neck supple and no masses   Cardiovascular: regular rate and  rhythm   Respiratory:  appears well, vitals normal, no respiratory distress, acyanotic, normal RR, ear and throat exam is normal, chest clear, no wheezing, crepitations, rhonchi, normal symmetric air entry   Abdomen: soft, non tender   Urinary: urethral meatus normal      Assessment:    Pregnancy: G2P1001 Patient Active Problem List   Diagnosis Date Noted  . Exercise-induced asthma 12/29/2010  . Anesthesia complication 12/29/2010        Plan:     Initial labs drawn. Prenatal vitamins. Problem list reviewed and updated. Genetic Screening discussed First Screen: declined.  Ultrasound discussed; fetal survey: requested.  Will do at 19 1/2 weeks  Follow up in 4 weeks. Diclegis for nausea. Continue Zantac  Anavi Branscum H. 11/17/2012  Addendum:  Diclegis is contraindicated in breast feeding--pt still breast feeds 2x day.  Pt aware not to take diclegis when breast feeding (RN Chestine Spore called pt).

## 2012-11-17 NOTE — Addendum Note (Signed)
Addended by: Granville Lewis on: 11/17/2012 04:47 PM   Modules accepted: Orders

## 2012-11-18 LAB — OBSTETRIC PANEL
Eosinophils Absolute: 0.1 10*3/uL (ref 0.0–0.7)
Eosinophils Relative: 1 % (ref 0–5)
HCT: 34.4 % — ABNORMAL LOW (ref 36.0–46.0)
Hemoglobin: 11.5 g/dL — ABNORMAL LOW (ref 12.0–15.0)
Lymphs Abs: 1.8 10*3/uL (ref 0.7–4.0)
MCH: 29.3 pg (ref 26.0–34.0)
MCV: 87.8 fL (ref 78.0–100.0)
Monocytes Relative: 9 % (ref 3–12)
RBC: 3.92 MIL/uL (ref 3.87–5.11)
Rh Type: POSITIVE
Rubella: 0.92 Index — ABNORMAL HIGH (ref <0.90)

## 2012-11-19 LAB — CULTURE, URINE COMPREHENSIVE
Colony Count: NO GROWTH
Organism ID, Bacteria: NO GROWTH

## 2012-12-19 ENCOUNTER — Ambulatory Visit (INDEPENDENT_AMBULATORY_CARE_PROVIDER_SITE_OTHER): Payer: No Typology Code available for payment source | Admitting: Family

## 2012-12-19 VITALS — BP 130/63 | Wt 203.0 lb

## 2012-12-19 DIAGNOSIS — Z3480 Encounter for supervision of other normal pregnancy, unspecified trimester: Secondary | ICD-10-CM

## 2012-12-19 DIAGNOSIS — Z23 Encounter for immunization: Secondary | ICD-10-CM

## 2012-12-19 DIAGNOSIS — Z2839 Other underimmunization status: Secondary | ICD-10-CM

## 2012-12-19 DIAGNOSIS — Z3490 Encounter for supervision of normal pregnancy, unspecified, unspecified trimester: Secondary | ICD-10-CM

## 2012-12-19 DIAGNOSIS — Z283 Underimmunization status: Secondary | ICD-10-CM | POA: Insufficient documentation

## 2012-12-19 DIAGNOSIS — O9989 Other specified diseases and conditions complicating pregnancy, childbirth and the puerperium: Secondary | ICD-10-CM

## 2012-12-19 MED ORDER — INFLUENZA VAC SPLIT QUAD 0.5 ML IM SUSP
0.5000 mL | Freq: Once | INTRAMUSCULAR | Status: AC
  Administered 2012-12-19: 0.5 mL via INTRAMUSCULAR

## 2012-12-19 MED ORDER — TETANUS-DIPHTH-ACELL PERTUSSIS 5-2.5-18.5 LF-MCG/0.5 IM SUSP: 0.5000 mL | Freq: Once | INTRAMUSCULAR | Status: DC

## 2012-12-19 MED ORDER — CYCLOBENZAPRINE HCL 10 MG PO TABS: 10.0000 mg | ORAL_TABLET | Freq: Three times a day (TID) | ORAL | Status: DC | PRN

## 2012-12-19 NOTE — Progress Notes (Signed)
No questions or concerns, doing well.  Reviewed lab results.  Obtain flu vaccine today.

## 2012-12-19 NOTE — Addendum Note (Signed)
Addended by: Melissa Noon on: 12/19/2012 10:58 AM   Modules accepted: Orders

## 2012-12-19 NOTE — Progress Notes (Signed)
p-79 

## 2013-01-16 ENCOUNTER — Encounter: Payer: Self-pay | Admitting: Advanced Practice Midwife

## 2013-01-16 ENCOUNTER — Ambulatory Visit (INDEPENDENT_AMBULATORY_CARE_PROVIDER_SITE_OTHER): Payer: No Typology Code available for payment source | Admitting: Advanced Practice Midwife

## 2013-01-16 VITALS — BP 113/61 | Wt 208.0 lb

## 2013-01-16 DIAGNOSIS — Z3480 Encounter for supervision of other normal pregnancy, unspecified trimester: Secondary | ICD-10-CM

## 2013-01-16 DIAGNOSIS — Z3482 Encounter for supervision of other normal pregnancy, second trimester: Secondary | ICD-10-CM

## 2013-01-16 NOTE — Progress Notes (Signed)
Doing well.  Denies vaginal bleeding, LOF, cramping/contractions.  Reports some occasional sharp inguinal pain with movement, same as her last pregnancy.  Reviewed normal pregnancy discomforts/round ligament pain. Pt has new insurance starting December 1.  Anatomy U/S and next prenatal visit scheduled in 4 weeks when pt [redacted]w[redacted]d.

## 2013-01-16 NOTE — Progress Notes (Signed)
p=76 

## 2013-02-02 ENCOUNTER — Encounter: Payer: Self-pay | Admitting: Obstetrics & Gynecology

## 2013-02-14 ENCOUNTER — Ambulatory Visit (HOSPITAL_COMMUNITY): Admission: RE | Admit: 2013-02-14 | Payer: No Typology Code available for payment source | Source: Ambulatory Visit

## 2013-02-16 ENCOUNTER — Encounter: Payer: Self-pay | Admitting: Obstetrics & Gynecology

## 2013-02-16 ENCOUNTER — Ambulatory Visit (INDEPENDENT_AMBULATORY_CARE_PROVIDER_SITE_OTHER): Payer: No Typology Code available for payment source | Admitting: Obstetrics & Gynecology

## 2013-02-16 VITALS — BP 124/70 | Wt 218.0 lb

## 2013-02-16 DIAGNOSIS — Z3480 Encounter for supervision of other normal pregnancy, unspecified trimester: Secondary | ICD-10-CM

## 2013-02-16 DIAGNOSIS — Z3490 Encounter for supervision of normal pregnancy, unspecified, unspecified trimester: Secondary | ICD-10-CM

## 2013-02-16 NOTE — Progress Notes (Signed)
Routine visit. Good FM. No problems. She has an anatomy u/s schedule for Mar 20, 2013 as that is when her new insurance will pay.

## 2013-02-16 NOTE — Progress Notes (Signed)
97.1

## 2013-03-16 NOTE — L&D Delivery Note (Signed)

## 2013-03-16 NOTE — L&D Delivery Note (Signed)
Delivery Note  At 2330, after about a 30 minute 2nd stage a viable female was delivered via  (Presentation:LOA ).  A small midline episiotomy had been cut just prior to delivery, as the baby was bradycardic and the tissues weren't stretching.  APGAR:6/9 ; weight 9#7oz. The shoulders were not forthcoming, so the posterior (L) axilla was grasped with my index finger, and the baby was rotated clockwise into the oblique diameter.  At this point, the (now) anterior shoulder was released, and the baby delivered.  At no time was any traction placed on the baby's head.  Cord pH:  pending  40 units of pitocin diluted in 1000cc LR was infused rapidly IV.  The placenta separated spontaneously and delivered via CCT and maternal pushing effort.  It was inspected and appears to be intact with a 3 VC.  There were the following complications: mild shoulder dystocia X 45 seconds  Anesthesia: Epidural  Episiotomy: 2nd lac, repaired by [name] BambergMario Mani PA-S under my supervision Lacerations: no extension Suture Repair: 3.0 chromic Est. Blood Loss (mL): 350  Mom to postpartum.  Baby to Couplet care / Skin to Skin.

## 2013-03-20 ENCOUNTER — Ambulatory Visit (HOSPITAL_COMMUNITY): Payer: BC Managed Care – PPO

## 2013-03-21 ENCOUNTER — Encounter: Payer: No Typology Code available for payment source | Admitting: Obstetrics & Gynecology

## 2013-03-30 ENCOUNTER — Ambulatory Visit (HOSPITAL_COMMUNITY)
Admission: RE | Admit: 2013-03-30 | Discharge: 2013-03-30 | Disposition: A | Discharge status: Home / Self Care | Payer: BC Managed Care – PPO | Source: Ambulatory Visit | Attending: Advanced Practice Midwife | Admitting: Advanced Practice Midwife

## 2013-03-30 ENCOUNTER — Other Ambulatory Visit: Payer: Self-pay | Admitting: Advanced Practice Midwife

## 2013-03-30 DIAGNOSIS — Z3689 Encounter for other specified antenatal screening: Secondary | ICD-10-CM | POA: Insufficient documentation

## 2013-03-30 DIAGNOSIS — Z3402 Encounter for supervision of normal first pregnancy, second trimester: Secondary | ICD-10-CM

## 2013-03-30 DIAGNOSIS — Z3482 Encounter for supervision of other normal pregnancy, second trimester: Secondary | ICD-10-CM

## 2013-03-31 ENCOUNTER — Encounter: Payer: Self-pay | Admitting: Obstetrics and Gynecology

## 2013-03-31 ENCOUNTER — Ambulatory Visit (INDEPENDENT_AMBULATORY_CARE_PROVIDER_SITE_OTHER): Payer: BC Managed Care – PPO | Admitting: Obstetrics and Gynecology

## 2013-03-31 VITALS — BP 132/63 | Wt 232.0 lb

## 2013-03-31 DIAGNOSIS — Z3480 Encounter for supervision of other normal pregnancy, unspecified trimester: Secondary | ICD-10-CM

## 2013-03-31 DIAGNOSIS — Z3490 Encounter for supervision of normal pregnancy, unspecified, unspecified trimester: Secondary | ICD-10-CM

## 2013-03-31 LAB — CBC
HCT: 31.9 % — ABNORMAL LOW (ref 36.0–46.0)
HEMOGLOBIN: 10.9 g/dL — AB (ref 12.0–15.0)
MCH: 30.4 pg (ref 26.0–34.0)
MCHC: 34.2 g/dL (ref 30.0–36.0)
MCV: 89.1 fL (ref 78.0–100.0)
Platelets: 230 10*3/uL (ref 150–400)
RBC: 3.58 MIL/uL — ABNORMAL LOW (ref 3.87–5.11)
RDW: 13.5 % (ref 11.5–15.5)
WBC: 11.3 10*3/uL — AB (ref 4.0–10.5)

## 2013-03-31 NOTE — Progress Notes (Signed)
P - 94 - 28 week labs today

## 2013-03-31 NOTE — Patient Instructions (Signed)
Second Trimester of Pregnancy The second trimester is from week 13 through week 28, months 4 through 6. The second trimester is often a time when you feel your best. Your body has also adjusted to being pregnant, and you begin to feel better physically. Usually, morning sickness has lessened or quit completely, you may have more energy, and you may have an increase in appetite. The second trimester is also a time when the fetus is growing rapidly. At the end of the sixth month, the fetus is about 9 inches long and weighs about 1 pounds. You will likely begin to feel the baby move (quickening) between 18 and 20 weeks of the pregnancy. BODY CHANGES Your body goes through many changes during pregnancy. The changes vary from woman to woman.   Your weight will continue to increase. You will notice your lower abdomen bulging out.  You may begin to get stretch marks on your hips, abdomen, and breasts.  You may develop headaches that can be relieved by medicines approved by your caregiver.  You may urinate more often because the fetus is pressing on your bladder.  You may develop or continue to have heartburn as a result of your pregnancy.  You may develop constipation because certain hormones are causing the muscles that push waste through your intestines to slow down.  You may develop hemorrhoids or swollen, bulging veins (varicose veins).  You may have back pain because of the weight gain and pregnancy hormones relaxing your joints between the bones in your pelvis and as a result of a shift in weight and the muscles that support your balance.  Your breasts will continue to grow and be tender.  Your gums may bleed and may be sensitive to brushing and flossing.  Dark spots or blotches (chloasma, mask of pregnancy) may develop on your face. This will likely fade after the baby is born.  A dark line from your belly button to the pubic area (linea nigra) may appear. This will likely fade after the  baby is born. WHAT TO EXPECT AT YOUR PRENATAL VISITS During a routine prenatal visit:  You will be weighed to make sure you and the fetus are growing normally.  Your blood pressure will be taken.  Your abdomen will be measured to track your baby's growth.  The fetal heartbeat will be listened to.  Any test results from the previous visit will be discussed. Your caregiver may ask you:  How you are feeling.  If you are feeling the baby move.  If you have had any abnormal symptoms, such as leaking fluid, bleeding, severe headaches, or abdominal cramping.  If you have any questions. Other tests that may be performed during your second trimester include:  Blood tests that check for:  Low iron levels (anemia).  Gestational diabetes (between 24 and 28 weeks).  Rh antibodies.  Urine tests to check for infections, diabetes, or protein in the urine.  An ultrasound to confirm the proper growth and development of the baby.  An amniocentesis to check for possible genetic problems.  Fetal screens for spina bifida and Down syndrome. HOME CARE INSTRUCTIONS   Avoid all smoking, herbs, alcohol, and unprescribed drugs. These chemicals affect the formation and growth of the baby.  Follow your caregiver's instructions regarding medicine use. There are medicines that are either safe or unsafe to take during pregnancy.  Exercise only as directed by your caregiver. Experiencing uterine cramps is a good sign to stop exercising.  Continue to eat regular,   healthy meals.  Wear a good support bra for breast tenderness.  Do not use hot tubs, steam rooms, or saunas.  Wear your seat belt at all times when driving.  Avoid raw meat, uncooked cheese, cat litter boxes, and soil used by cats. These carry germs that can cause birth defects in the baby.  Take your prenatal vitamins.  Try taking a stool softener (if your caregiver approves) if you develop constipation. Eat more high-fiber foods,  such as fresh vegetables or fruit and whole grains. Drink plenty of fluids to keep your urine clear or pale yellow.  Take warm sitz baths to soothe any pain or discomfort caused by hemorrhoids. Use hemorrhoid cream if your caregiver approves.  If you develop varicose veins, wear support hose. Elevate your feet for 15 minutes, 3 4 times a day. Limit salt in your diet.  Avoid heavy lifting, wear low heel shoes, and practice good posture.  Rest with your legs elevated if you have leg cramps or low back pain.  Visit your dentist if you have not gone yet during your pregnancy. Use a soft toothbrush to brush your teeth and be gentle when you floss.  A sexual relationship may be continued unless your caregiver directs you otherwise.  Continue to go to all your prenatal visits as directed by your caregiver. SEEK MEDICAL CARE IF:   You have dizziness.  You have mild pelvic cramps, pelvic pressure, or nagging pain in the abdominal area.  You have persistent nausea, vomiting, or diarrhea.  You have a bad smelling vaginal discharge.  You have pain with urination. SEEK IMMEDIATE MEDICAL CARE IF:   You have a fever.  You are leaking fluid from your vagina.  You have spotting or bleeding from your vagina.  You have severe abdominal cramping or pain.  You have rapid weight gain or loss.  You have shortness of breath with chest pain.  You notice sudden or extreme swelling of your face, hands, ankles, feet, or legs.  You have not felt your baby move in over an hour.  You have severe headaches that do not go away with medicine.  You have vision changes. Document Released: 02/24/2001 Document Revised: 11/02/2012 Document Reviewed: 05/03/2012 [employer] Patient Information 2014 [employer].  

## 2013-03-31 NOTE — Progress Notes (Signed)
Doing well. 28 wk labs today. No UTI sx. Asking about whether shooting a gun is detrimental to fetus> referred to ask Pediatrician.

## 2013-04-01 LAB — GLUCOSE TOLERANCE, 1 HOUR (50G) W/O FASTING: GLUCOSE 1 HOUR GTT: 110 mg/dL (ref 70–140)

## 2013-04-01 LAB — RPR

## 2013-04-01 LAB — HIV ANTIBODY (ROUTINE TESTING W REFLEX): HIV: NONREACTIVE

## 2013-04-03 ENCOUNTER — Telehealth: Payer: Self-pay | Admitting: *Deleted

## 2013-04-03 NOTE — Telephone Encounter (Signed)
Pt notified of normal 1 hr GTT and encourage her to increase protein or add a flintstone with iron to daily meds due to decrease in Hgb

## 2013-04-14 ENCOUNTER — Encounter: Payer: BC Managed Care – PPO | Admitting: Family

## 2013-04-14 ENCOUNTER — Encounter: Payer: Self-pay | Admitting: Family

## 2013-04-21 ENCOUNTER — Encounter: Payer: BC Managed Care – PPO | Admitting: Obstetrics & Gynecology

## 2013-04-24 ENCOUNTER — Encounter: Payer: Self-pay | Admitting: Advanced Practice Midwife

## 2013-04-24 ENCOUNTER — Ambulatory Visit (INDEPENDENT_AMBULATORY_CARE_PROVIDER_SITE_OTHER): Payer: BC Managed Care – PPO | Admitting: Advanced Practice Midwife

## 2013-04-24 VITALS — BP 131/73 | Wt 237.0 lb

## 2013-04-24 DIAGNOSIS — Z3480 Encounter for supervision of other normal pregnancy, unspecified trimester: Secondary | ICD-10-CM

## 2013-04-24 DIAGNOSIS — Z3490 Encounter for supervision of normal pregnancy, unspecified, unspecified trimester: Secondary | ICD-10-CM

## 2013-04-24 DIAGNOSIS — Z23 Encounter for immunization: Secondary | ICD-10-CM

## 2013-04-24 MED ORDER — TETANUS-DIPHTH-ACELL PERTUSSIS 5-2.5-18.5 LF-MCG/0.5 IM SUSP: 0.5000 mL | Freq: Once | INTRAMUSCULAR | Status: DC

## 2013-04-24 NOTE — Progress Notes (Signed)
p=[id number] 

## 2013-04-24 NOTE — Patient Instructions (Signed)
Norethindrone tablets (contraception) What is this medicine? NORETHINDRONE (nor eth IN drone) is an oral contraceptive. The product contains a female hormone known as a progestin. It is used to prevent pregnancy. This medicine may be used for other purposes; ask your health care provider or pharmacist if you have questions. COMMON BRAND NAME(S): Camila, Errin , Heather, Jencycla, Jolivette , Lyza, Nor-QD, Nora-BE, Ortho Micronor What should I tell my health care provider before I take this medicine? They need to know if you have any of these conditions: -blood vessel disease or blood clots -breast, cervical, or vaginal cancer -diabetes -heart disease -kidney disease -liver disease -mental depression -migraine -seizures -stroke -vaginal bleeding -an unusual or allergic reaction to norethindrone, other medicines, foods, dyes, or preservatives -pregnant or trying to get pregnant -breast-feeding How should I use this medicine? Take this medicine by mouth with a glass of water. You may take it with or without food. Follow the directions on the prescription label. Take this medicine at the same time each day and in the order directed on the package. Do not take your medicine more often than directed. Contact your pediatrician regarding the use of this medicine in children. Special care may be needed. This medicine has been used in female children who have started having menstrual periods. A patient package insert for the product will be given with each prescription and refill. Read this sheet carefully each time. The sheet may change frequently. Overdosage: If you think you have taken too much of this medicine contact a poison control center or emergency room at once. NOTE: This medicine is only for you. Do not share this medicine with others. What if I miss a dose? Try not to miss a dose. Every time you miss a dose or take a dose late your chance of pregnancy increases. When 1 pill is missed  (even if only 3 hours late), take the missed pill as soon as possible and continue taking a pill each day at the regular time (use a back up method of birth control for the next 48 hours). If more than 1 dose is missed, use an additional birth control method for the rest of your pill pack until menses occurs. Contact your health care professional if more than 1 dose has been missed. What may interact with this medicine? Do not take this medicine with any of the following medications: -amprenavir or fosamprenavir -bosentan This medicine may also interact with the following medications: -antibiotics or medicines for infections, especially rifampin, rifabutin, rifapentine, and griseofulvin, and possibly penicillins or tetracyclines -aprepitant -barbiturate medicines, such as phenobarbital -carbamazepine -felbamate -modafinil -oxcarbazepine -phenytoin -ritonavir or other medicines for HIV infection or AIDS -St. John's wort -topiramate This list may not describe all possible interactions. Give your health care provider a list of all the medicines, herbs, non-prescription drugs, or dietary supplements you use. Also tell them if you smoke, drink alcohol, or use illegal drugs. Some items may interact with your medicine. What should I watch for while using this medicine? Visit your doctor or health care professional for regular checks on your progress. You will need a regular breast and pelvic exam and Pap smear while on this medicine. Use an additional method of birth control during the first cycle that you take these tablets. If you have any reason to think you are pregnant, stop taking this medicine right away and contact your doctor or health care professional. If you are taking this medicine for hormone related problems, it may take several   cycles of use to see improvement in your condition. This medicine does not protect you against HIV infection (AIDS) or any other sexually transmitted  diseases. What side effects may I notice from receiving this medicine? Side effects that you should report to your doctor or health care professional as soon as possible: -breast tenderness or discharge -pain in the abdomen, chest, groin or leg -severe headache -skin rash, itching, or hives -sudden shortness of breath -unusually weak or tired -vision or speech problems -yellowing of skin or eyes Side effects that usually do not require medical attention (report to your doctor or health care professional if they continue or are bothersome): -changes in sexual desire -change in menstrual flow -facial hair growth -fluid retention and swelling -headache -irritability -nausea -weight gain or loss This list may not describe all possible side effects. Call your doctor for medical advice about side effects. You may report side effects to FDA at 1-800-FDA-1088. Where should I keep my medicine? Keep out of the reach of children. Store at room temperature between 15 and 30 degrees C (59 and 86 degrees F). Throw away any unused medicine after the expiration date. NOTE: This sheet is a summary. It may not cover all possible information. If you have questions about this medicine, talk to your doctor, pharmacist, or health care provider.  2014, [employer]. (2011-11-20 16:41:35)  Breastfeeding Deciding to breastfeed is one of the best choices you can make for you and your baby. A change in hormones during pregnancy causes your breast tissue to grow and increases the number and size of your milk ducts. These hormones also allow proteins, sugars, and fats from your blood supply to make breast milk in your milk-producing glands. Hormones prevent breast milk from being released before your baby is born as well as prompt milk flow after birth. Once breastfeeding has begun, thoughts of your baby, as well as his or her sucking or crying, can stimulate the release of milk from your milk-producing  glands.  BENEFITS OF BREASTFEEDING For Your Baby  Your first milk (colostrum) helps your baby's digestive system function better.   There are antibodies in your milk that help your baby fight off infections.   Your baby has a lower incidence of asthma, allergies, and sudden infant death syndrome.   The nutrients in breast milk are better for your baby than infant formulas and are designed uniquely for your baby's needs.   Breast milk improves your baby's brain development.   Your baby is less likely to develop other conditions, such as childhood obesity, asthma, or type 2 diabetes mellitus.  For You   Breastfeeding helps to create a very special bond between you and your baby.   Breastfeeding is convenient. Breast milk is always available at the correct temperature and costs nothing.   Breastfeeding helps to burn calories and helps you lose the weight gained during pregnancy.   Breastfeeding makes your uterus contract to its prepregnancy size faster and slows bleeding (lochia) after you give birth.   Breastfeeding helps to lower your risk of developing type 2 diabetes mellitus, osteoporosis, and breast or ovarian cancer later in life. SIGNS THAT YOUR BABY IS HUNGRY Early Signs of Hunger  Increased alertness or activity.  Stretching.  Movement of the head from side to side.  Movement of the head and opening of the mouth when the corner of the mouth or cheek is stroked (rooting).  Increased sucking sounds, smacking lips, cooing, sighing, or squeaking.  Hand-to-mouth movements.  Increased sucking of fingers or hands. Late Signs of Hunger  Fussing.  Intermittent crying. Extreme Signs of Hunger Signs of extreme hunger will require calming and consoling before your baby will be able to breastfeed successfully. Do not wait for the following signs of extreme hunger to occur before you initiate breastfeeding:   Restlessness.  A loud, strong cry.    Screaming. BREASTFEEDING BASICS Breastfeeding Initiation  Find a comfortable place to sit or lie down, with your neck and back well supported.  Place a pillow or rolled up blanket under your baby to bring him or her to the level of your breast (if you are seated). Nursing pillows are specially designed to help support your arms and your baby while you breastfeed.  Make sure that your baby's abdomen is facing your abdomen.   Gently massage your breast. With your fingertips, massage from your chest wall toward your nipple in a circular motion. This encourages milk flow. You may need to continue this action during the feeding if your milk flows slowly.  Support your breast with 4 fingers underneath and your thumb above your nipple. Make sure your fingers are well away from your nipple and your baby's mouth.   Stroke your baby's lips gently with your finger or nipple.   When your baby's mouth is open wide enough, quickly bring your baby to your breast, placing your entire nipple and as much of the colored area around your nipple (areola) as possible into your baby's mouth.   More areola should be visible above your baby's upper lip than below the lower lip.   Your baby's tongue should be between his or her lower gum and your breast.   Ensure that your baby's mouth is correctly positioned around your nipple (latched). Your baby's lips should create a seal on your breast and be turned out (everted).  It is common for your baby to suck about 2 3 minutes in order to start the flow of breast milk. Latching Teaching your baby how to latch on to your breast properly is very important. An improper latch can cause nipple pain and decreased milk supply for you and poor weight gain in your baby. Also, if your baby is not latched onto your nipple properly, he or she may swallow some air during feeding. This can make your baby fussy. Burping your baby when you switch breasts during the feeding can  help to get rid of the air. However, teaching your baby to latch on properly is still the best way to prevent fussiness from swallowing air while breastfeeding. Signs that your baby has successfully latched on to your nipple:    Silent tugging or silent sucking, without causing you pain.   Swallowing heard between every 3 4 sucks.    Muscle movement above and in front of his or her ears while sucking.  Signs that your baby has not successfully latched on to nipple:   Sucking sounds or smacking sounds from your baby while breastfeeding.  Nipple pain. If you think your baby has not latched on correctly, slip your finger into the corner of your baby's mouth to break the suction and place it between your baby's gums. Attempt breastfeeding initiation again. Signs of Successful Breastfeeding Signs from your baby:   A gradual decrease in the number of sucks or complete cessation of sucking.   Falling asleep.   Relaxation of his or her body.   Retention of a small amount of milk in his or her  mouth.   Letting go of your breast by himself or herself. Signs from you:  Breasts that have increased in firmness, weight, and size 1 3 hours after feeding.   Breasts that are softer immediately after breastfeeding.  Increased milk volume, as well as a change in milk consistency and color by the 5th day of breastfeeding.   Nipples that are not sore, cracked, or bleeding. Signs That Your Pecola LeisureBaby is Getting Enough Milk  Wetting at least 3 diapers in a 24-hour period. The urine should be clear and pale yellow by age 60 days.  At least 3 stools in a 24-hour period by age 60 days. The stool should be soft and yellow.  At least 3 stools in a 24-hour period by age 488 days. The stool should be seedy and yellow.  No loss of weight greater than 10% of birth weight during the first 233 days of age.  Average weight gain of 4 7 ounces (120 210 mL) per week after age 27 days.  Consistent daily weight  gain by age 60 days, without weight loss after the age of 2 weeks. After a feeding, your baby may spit up a small amount. This is common. BREASTFEEDING FREQUENCY AND DURATION Frequent feeding will help you make more milk and can prevent sore nipples and breast engorgement. Breastfeed when you feel the need to reduce the fullness of your breasts or when your baby shows signs of hunger. This is called "breastfeeding on demand." Avoid introducing a pacifier to your baby while you are working to establish breastfeeding (the first 4 6 weeks after your baby is born). After this time you may choose to use a pacifier. Research has shown that pacifier use during the first year of a baby's life decreases the risk of sudden infant death syndrome (SIDS). Allow your baby to feed on each breast as long as he or she wants. Breastfeed until your baby is finished feeding. When your baby unlatches or falls asleep while feeding from the first breast, offer the second breast. Because newborns are often sleepy in the first few weeks of life, you may need to awaken your baby to get him or her to feed. Breastfeeding times will vary from baby to baby. However, the following rules can serve as a guide to help you ensure that your baby is properly fed:  Newborns (babies 634 weeks of age or younger) may breastfeed every 1 3 hours.  Newborns should not go longer than 3 hours during the day or 5 hours during the night without breastfeeding.  You should breastfeed your baby a minimum of 8 times in a 24-hour period until you begin to introduce solid foods to your baby at around 136 months of age. BREAST MILK PUMPING Pumping and storing breast milk allows you to ensure that your baby is exclusively fed your breast milk, even at times when you are unable to breastfeed. This is especially important if you are going back to work while you are still breastfeeding or when you are not able to be present during feedings. Your lactation consultant  can give you guidelines on how long it is safe to store breast milk.  A breast pump is a machine that allows you to pump milk from your breast into a sterile bottle. The pumped breast milk can then be stored in a refrigerator or freezer. Some breast pumps are operated by hand, while others use electricity. Ask your lactation consultant which type will work best for you. Breast  pumps can be purchased, but some hospitals and breastfeeding support groups lease breast pumps on a monthly basis. A lactation consultant can teach you how to hand express breast milk, if you prefer not to use a pump.  CARING FOR YOUR BREASTS WHILE YOU BREASTFEED Nipples can become dry, cracked, and sore while breastfeeding. The following recommendations can help keep your breasts moisturized and healthy:  Avoid using soap on your nipples.   Wear a supportive bra. Although not required, special nursing bras and tank tops are designed to allow access to your breasts for breastfeeding without taking off your entire bra or top. Avoid wearing underwire style bras or extremely tight bras.  Air dry your nipples for 3 after each feeding.   Use only cotton bra pads to absorb leaked breast milk. Leaking of breast milk between feedings is normal.   Use lanolin on your nipples after breastfeeding. Lanolin helps to maintain your skin's normal moisture barrier. If you use pure lanolin you do not need to wash it off before feeding your baby again. Pure lanolin is not toxic to your baby. You may also hand express a few drops of breast milk and gently massage that milk into your nipples and allow the milk to air dry. In the first few weeks after giving birth, some women experience extremely full breasts (engorgement). Engorgement can make your breasts feel heavy, warm, and tender to the touch. Engorgement peaks within 3 5 days after you give birth. The following recommendations can help ease engorgement:  Completely empty your  breasts while breastfeeding or pumping. You may want to start by applying warm, moist heat (in the shower or with warm water-soaked hand towels) just before feeding or pumping. This increases circulation and helps the milk flow. If your baby does not completely empty your breasts while breastfeeding, pump any extra milk after he or she is finished.  Wear a snug bra (nursing or regular) or tank top for 1 2 days to signal your body to slightly decrease milk production.  Apply ice packs to your breasts, unless this is too uncomfortable for you.  Make sure that your baby is latched on and positioned properly while breastfeeding. If engorgement persists after 48 hours of following these recommendations, contact your health care provider or a Advertising copywriter. OVERALL HEALTH CARE RECOMMENDATIONS WHILE BREASTFEEDING  Eat healthy foods. Alternate between meals and snacks, eating 3 of each per day. Because what you eat affects your breast milk, some of the foods may make your baby more irritable than usual. Avoid eating these foods if you are sure that they are negatively affecting your baby.  Drink milk, fruit juice, and water to satisfy your thirst (about 10 glasses a day).   Rest often, relax, and continue to take your prenatal vitamins to prevent fatigue, stress, and anemia.  Continue breast self-awareness checks.  Avoid chewing and smoking tobacco.  Avoid alcohol and drug use. Some medicines that may be harmful to your baby can pass through breast milk. It is important to ask your health care provider before taking any medicine, including all over-the-counter and prescription medicine as well as vitamin and herbal supplements. It is possible to become pregnant while breastfeeding. If birth control is desired, ask your health care provider about options that will be safe for your baby. SEEK MEDICAL CARE IF:   You feel like you want to stop breastfeeding or have become frustrated with  breastfeeding.  You have painful breasts or nipples.  Your nipples  are cracked or bleeding.  Your breasts are red, tender, or warm.  You have a swollen area on either breast.  You have a fever or chills.  You have nausea or vomiting.  You have drainage other than breast milk from your nipples.  Your breasts do not become full before feedings by the 5th day after you give birth.  You feel sad and depressed.  Your baby is too sleepy to eat well.  Your baby is having trouble sleeping.   Your baby is wetting less than 3 diapers in a 24-hour period.  Your baby has less than 3 stools in a 24-hour period.  Your baby's skin or the white part of his or her eyes becomes yellow.   Your baby is not gaining weight by 78 days of age. SEEK IMMEDIATE MEDICAL CARE IF:   Your baby is overly tired (lethargic) and does not want to wake up and feed.  Your baby develops an unexplained fever. Document Released: 03/02/2005 Document Revised: 11/02/2012 Document Reviewed: 08/24/2012 [facility] Patient Information 2014 [place], [place].

## 2013-04-24 NOTE — Progress Notes (Signed)
Reviewed 28 week labs. Declines F/U anatomy for heart views. TDaP. Watch wt gain.

## 2013-05-15 ENCOUNTER — Ambulatory Visit (INDEPENDENT_AMBULATORY_CARE_PROVIDER_SITE_OTHER): Payer: BC Managed Care – PPO | Admitting: Advanced Practice Midwife

## 2013-05-15 ENCOUNTER — Encounter: Payer: Self-pay | Admitting: Advanced Practice Midwife

## 2013-05-15 VITALS — BP 111/73 | Wt 246.0 lb

## 2013-05-15 DIAGNOSIS — Z3480 Encounter for supervision of other normal pregnancy, unspecified trimester: Secondary | ICD-10-CM

## 2013-05-15 DIAGNOSIS — Z3490 Encounter for supervision of normal pregnancy, unspecified, unspecified trimester: Secondary | ICD-10-CM

## 2013-05-15 DIAGNOSIS — K6289 Other specified diseases of anus and rectum: Secondary | ICD-10-CM

## 2013-05-15 DIAGNOSIS — R635 Abnormal weight gain: Secondary | ICD-10-CM

## 2013-05-15 DIAGNOSIS — O139 Gestational [pregnancy-induced] hypertension without significant proteinuria, unspecified trimester: Secondary | ICD-10-CM

## 2013-05-15 DIAGNOSIS — IMO0002 Reserved for concepts with insufficient information to code with codable children: Secondary | ICD-10-CM

## 2013-05-15 DIAGNOSIS — O2603 Excessive weight gain in pregnancy, third trimester: Secondary | ICD-10-CM

## 2013-05-15 DIAGNOSIS — O133 Gestational [pregnancy-induced] hypertension without significant proteinuria, third trimester: Secondary | ICD-10-CM

## 2013-05-15 MED ORDER — HYDROCORTISONE ACE-PRAMOXINE 1-1 % RE FOAM: 1.0000 | Freq: Two times a day (BID) | RECTAL | Status: AC

## 2013-05-15 NOTE — Patient Instructions (Signed)
[facility]  Hypertension During Pregnancy Hypertension is also called high blood pressure. It can occur at any time in life and during pregnancy. When you have hypertension, there is extra pressure inside your blood vessels that carry blood from the heart to the rest of your body (arteries). Hypertension during pregnancy can cause problems for you and your baby. Your baby might not weigh as much as it should at birth or might be born early (premature). Very bad cases of hypertension during pregnancy can be life threatening.  Different types of hypertension can occur during pregnancy.   Chronic hypertension. This happens when a woman has hypertension before pregnancy and it continues during pregnancy.  Gestational hypertension. This is when hypertension develops during pregnancy.  Preeclampsia or toxemia of pregnancy. This is a very serious type of hypertension that develops only during pregnancy. It is a disease that affects the whole body (systemic) and can be very dangerous for both mother and baby.  Gestational hypertension and preeclampsia usually go away after your baby is born. Blood pressure generally stabilizes within 6 weeks. Women who have hypertension during pregnancy have a greater chance of developing hypertension later in life or with future pregnancies. RISK FACTORS Some factors make you more likely to develop hypertension during pregnancy. Risk factors include:  Having hypertension before pregnancy.  Having hypertension during a previous pregnancy.  Being overweight.  Being older than 40.  Being pregnant with more than one baby (multiples).  Having diabetes or kidney problems. SIGNS AND SYMPTOMS Chronic and gestational hypertension rarely cause symptoms. Preeclampsia has symptoms, which may include:  Increased protein in your urine. Your health care provider will check for this at every prenatal visit.  Swelling of your hands and face.  Rapid weight  gain.  Headaches.  Visual changes.  Being bothered by light.  Abdominal pain, especially in the right upper area.  Chest pain.  Shortness of breath.  Increased reflexes.  Seizures. Seizures occur with a more severe form of preeclampsia, called eclampsia. DIAGNOSIS   You may be diagnosed with hypertension during a regular prenatal exam. At each visit, tests may include:  Blood pressure checks.  A urine test to check for protein in your urine.  The type of hypertension you are diagnosed with depends on when you developed it. It also depends on your specific blood pressure reading.  Developing hypertension before 20 weeks of pregnancy is consistent with chronic hypertension.  Developing hypertension after 20 weeks of pregnancy is consistent with gestational hypertension.  Hypertension with increased urinary protein is diagnosed as preeclampsia.  Blood pressure measurements that stay above 160 systolic or 110 diastolic are a sign of severe preeclampsia. TREATMENT Treatment for hypertension during pregnancy varies. Treatment depends on the type of hypertension and how serious it is.  If you take medicine for chronic hypertension, you may need to switch medicines.  Drugs called ACE inhibitors should not be taken during pregnancy.  Low-dose aspirin may be suggested for women who have risk factors for preeclampsia.  If you have gestational hypertension, you may need to take a blood pressure medicine that is safe during pregnancy. Your health care provider will recommend the appropriate medicine.  If you have severe preeclampsia, you may need to be in the hospital. Health care providers will watch you and the baby very closely. You also may need to take medicine (magnesium sulfate) to prevent seizures and lower blood pressure.  Sometimes an early delivery is needed. This may be the case if the condition worsens.  It would be done to protect you and the baby. The only cure for  preeclampsia is delivery. HOME CARE INSTRUCTIONS  Schedule and keep all of your regular appointments for prenatal care.  Only take over-the-counter or prescription medicines as directed by your health care provider. Tell your health care provider about all medicines you take.  Eat as little salt as possible.  Get regular exercise.  Do not drink alcohol.  Do not use tobacco products.  Do not drink products with caffeine.  Lie on your left side when resting. SEEK IMMEDIATE MEDICAL CARE IF:  You have severe abdominal pain.  You have sudden swelling in the hands, ankles, or face.  You gain 4 pounds (1.8 kg) or more in 1 week.  You vomit repeatedly.  You have vaginal bleeding.  You do not feel the baby moving as much.  You have a headache.  You have blurred or double vision.  You have muscle twitching or spasms.  You have shortness of breath.  You have blue fingernails and lips.  You have blood in your urine. MAKE SURE YOU:  Understand these instructions.  Will watch your condition.  Will get help right away if you are not doing well or get worse. Document Released: 11/18/2010 Document Revised: 12/21/2012 Document Reviewed: 09/29/2012 [facility] Patient Information 2014 [place], [place].

## 2013-05-15 NOTE — Progress Notes (Signed)
Watch BP and wt gain. Reports mild fluid retention. No HA, vision changes or epigastric pain. Pre-e precautions. Will likely recommend growth US if wt and/or BP continue to be an issue.

## 2013-05-15 NOTE — Progress Notes (Signed)
p-101  BP recheck 111/73 and p-87

## 2013-05-16 DIAGNOSIS — O133 Gestational [pregnancy-induced] hypertension without significant proteinuria, third trimester: Secondary | ICD-10-CM | POA: Insufficient documentation

## 2013-05-16 DIAGNOSIS — O2603 Excessive weight gain in pregnancy, third trimester: Secondary | ICD-10-CM | POA: Insufficient documentation

## 2013-06-02 ENCOUNTER — Encounter: Payer: Self-pay | Admitting: Family

## 2013-06-02 ENCOUNTER — Ambulatory Visit (INDEPENDENT_AMBULATORY_CARE_PROVIDER_SITE_OTHER): Payer: BC Managed Care – PPO | Admitting: Family

## 2013-06-02 VITALS — BP 125/75 | Wt 247.0 lb

## 2013-06-02 DIAGNOSIS — Z3490 Encounter for supervision of normal pregnancy, unspecified, unspecified trimester: Secondary | ICD-10-CM

## 2013-06-02 DIAGNOSIS — Z3480 Encounter for supervision of other normal pregnancy, unspecified trimester: Secondary | ICD-10-CM

## 2013-06-02 LAB — OB RESULTS CONSOLE GC/CHLAMYDIA
CHLAMYDIA, DNA PROBE: NEGATIVE
GC PROBE AMP, GENITAL: NEGATIVE

## 2013-06-02 LAB — OB RESULTS CONSOLE GBS: STREP GROUP B AG: NEGATIVE

## 2013-06-02 NOTE — Progress Notes (Signed)
p-92 

## 2013-06-02 NOTE — Progress Notes (Signed)
No questions or concerns.  Doing well.  Occasional sharp pain in lower pelvis.  No bleeding or leaking of fluid.  GBS and GC/CT collected.

## 2013-06-03 LAB — GC/CHLAMYDIA PROBE AMP
CT PROBE, AMP APTIMA: NEGATIVE
GC PROBE AMP APTIMA: NEGATIVE

## 2013-06-05 ENCOUNTER — Encounter: Payer: Self-pay | Admitting: Family

## 2013-06-05 LAB — CULTURE, BETA STREP (GROUP B ONLY)

## 2013-06-09 ENCOUNTER — Ambulatory Visit (INDEPENDENT_AMBULATORY_CARE_PROVIDER_SITE_OTHER): Payer: BC Managed Care – PPO | Admitting: Advanced Practice Midwife

## 2013-06-09 VITALS — BP 129/74 | Wt 251.0 lb

## 2013-06-09 DIAGNOSIS — Z3480 Encounter for supervision of other normal pregnancy, unspecified trimester: Secondary | ICD-10-CM

## 2013-06-09 NOTE — Progress Notes (Signed)
P = 98 

## 2013-06-09 NOTE — Progress Notes (Signed)
Doing well. Labor precautions.  Cultures and GBS Negative

## 2013-06-09 NOTE — Patient Instructions (Signed)

## 2013-06-14 ENCOUNTER — Ambulatory Visit (INDEPENDENT_AMBULATORY_CARE_PROVIDER_SITE_OTHER): Payer: BC Managed Care – PPO | Admitting: Advanced Practice Midwife

## 2013-06-14 VITALS — BP 129/81 | Wt 253.0 lb

## 2013-06-14 DIAGNOSIS — Z3490 Encounter for supervision of normal pregnancy, unspecified, unspecified trimester: Secondary | ICD-10-CM

## 2013-06-14 DIAGNOSIS — Z3480 Encounter for supervision of other normal pregnancy, unspecified trimester: Secondary | ICD-10-CM

## 2013-06-14 NOTE — Progress Notes (Signed)
Doing well.  Good fetal movement, denies vaginal bleeding, LOF, regular contractions.  Reviewed signs of labor.  

## 2013-06-14 NOTE — Progress Notes (Signed)
p-92 

## 2013-06-23 ENCOUNTER — Encounter: Payer: Self-pay | Admitting: Advanced Practice Midwife

## 2013-06-23 ENCOUNTER — Ambulatory Visit (INDEPENDENT_AMBULATORY_CARE_PROVIDER_SITE_OTHER): Payer: BC Managed Care – PPO | Admitting: Advanced Practice Midwife

## 2013-06-23 VITALS — BP 124/76 | Wt 254.0 lb

## 2013-06-23 DIAGNOSIS — Z3480 Encounter for supervision of other normal pregnancy, unspecified trimester: Secondary | ICD-10-CM

## 2013-06-23 DIAGNOSIS — Z3490 Encounter for supervision of normal pregnancy, unspecified, unspecified trimester: Secondary | ICD-10-CM

## 2013-06-23 NOTE — Progress Notes (Signed)
p-[id number] 

## 2013-06-23 NOTE — Progress Notes (Signed)
Doing well.  Good fetal movement, denies vaginal bleeding, LOF, regular contractions.  Reports varicose veins, discussed compression stockings/socks.  Reviewed signs of labor, reasons to come to hospital.

## 2013-06-29 ENCOUNTER — Encounter (HOSPITAL_COMMUNITY): Payer: Self-pay | Admitting: *Deleted

## 2013-06-29 ENCOUNTER — Inpatient Hospital Stay (HOSPITAL_COMMUNITY)
Admission: AD | Admit: 2013-06-29 | Discharge: 2013-07-01 | DRG: 775 | Disposition: A | Discharge status: Home / Self Care | Payer: BC Managed Care – PPO | Source: Ambulatory Visit | Attending: Obstetrics & Gynecology | Admitting: Obstetrics & Gynecology

## 2013-06-29 ENCOUNTER — Inpatient Hospital Stay (HOSPITAL_COMMUNITY): Payer: BC Managed Care – PPO | Admitting: Anesthesiology

## 2013-06-29 ENCOUNTER — Encounter (HOSPITAL_COMMUNITY): Payer: BC Managed Care – PPO | Admitting: Anesthesiology

## 2013-06-29 DIAGNOSIS — Z283 Underimmunization status: Secondary | ICD-10-CM

## 2013-06-29 DIAGNOSIS — O9989 Other specified diseases and conditions complicating pregnancy, childbirth and the puerperium: Secondary | ICD-10-CM

## 2013-06-29 DIAGNOSIS — Z833 Family history of diabetes mellitus: Secondary | ICD-10-CM

## 2013-06-29 DIAGNOSIS — K219 Gastro-esophageal reflux disease without esophagitis: Secondary | ICD-10-CM | POA: Diagnosis present

## 2013-06-29 DIAGNOSIS — Z3490 Encounter for supervision of normal pregnancy, unspecified, unspecified trimester: Secondary | ICD-10-CM

## 2013-06-29 DIAGNOSIS — IMO0001 Reserved for inherently not codable concepts without codable children: Secondary | ICD-10-CM

## 2013-06-29 DIAGNOSIS — O09899 Supervision of other high risk pregnancies, unspecified trimester: Secondary | ICD-10-CM

## 2013-06-29 DIAGNOSIS — L259 Unspecified contact dermatitis, unspecified cause: Secondary | ICD-10-CM | POA: Diagnosis present

## 2013-06-29 DIAGNOSIS — Z2839 Other underimmunization status: Secondary | ICD-10-CM

## 2013-06-29 DIAGNOSIS — Z8249 Family history of ischemic heart disease and other diseases of the circulatory system: Secondary | ICD-10-CM

## 2013-06-29 DIAGNOSIS — J45909 Unspecified asthma, uncomplicated: Secondary | ICD-10-CM | POA: Diagnosis present

## 2013-06-29 LAB — RPR

## 2013-06-29 LAB — CBC
HCT: 34.6 % — ABNORMAL LOW (ref 36.0–46.0)
Hemoglobin: 11.6 g/dL — ABNORMAL LOW (ref 12.0–15.0)
MCH: 30.3 pg (ref 26.0–34.0)
MCHC: 33.5 g/dL (ref 30.0–36.0)
MCV: 90.3 fL (ref 78.0–100.0)
PLATELETS: 189 10*3/uL (ref 150–400)
RBC: 3.83 MIL/uL — AB (ref 3.87–5.11)
RDW: 13.7 % (ref 11.5–15.5)
WBC: 13.3 10*3/uL — ABNORMAL HIGH (ref 4.0–10.5)

## 2013-06-29 LAB — ABO/RH: ABO/RH(D): O POS

## 2013-06-29 LAB — POCT FERN TEST: POCT Fern Test: POSITIVE

## 2013-06-29 LAB — TYPE AND SCREEN
ABO/RH(D): O POS
Antibody Screen: NEGATIVE

## 2013-06-29 MED ORDER — OXYCODONE-ACETAMINOPHEN 5-325 MG PO TABS: 1.0000 | ORAL_TABLET | ORAL | Status: DC | PRN

## 2013-06-29 MED ORDER — CITRIC ACID-SODIUM CITRATE 334-500 MG/5ML PO SOLN
30.0000 mL | ORAL | Status: DC | PRN
  Administered 2013-06-29 (×2): 30 mL via ORAL
  Filled 2013-06-29 (×2): qty 15

## 2013-06-29 MED ORDER — ONDANSETRON HCL 4 MG/2ML IJ SOLN: 4.0000 mg | Freq: Four times a day (QID) | INTRAMUSCULAR | Status: DC | PRN

## 2013-06-29 MED ORDER — OXYTOCIN 40 UNITS IN LACTATED RINGERS INFUSION - SIMPLE MED
62.5000 mL/h | INTRAVENOUS | Status: DC
  Administered 2013-06-30: 62.5 mL/h via INTRAVENOUS
  Filled 2013-06-29: qty 1000

## 2013-06-29 MED ORDER — FLEET ENEMA 7-19 GM/118ML RE ENEM: 1.0000 | ENEMA | RECTAL | Status: DC | PRN

## 2013-06-29 MED ORDER — PHENYLEPHRINE 40 MCG/ML (10ML) SYRINGE FOR IV PUSH (FOR BLOOD PRESSURE SUPPORT)
80.0000 ug | PREFILLED_SYRINGE | INTRAVENOUS | Status: DC | PRN
  Filled 2013-06-29: qty 2

## 2013-06-29 MED ORDER — PHENYLEPHRINE 40 MCG/ML (10ML) SYRINGE FOR IV PUSH (FOR BLOOD PRESSURE SUPPORT)
80.0000 ug | PREFILLED_SYRINGE | INTRAVENOUS | Status: DC | PRN
  Filled 2013-06-29: qty 2
  Filled 2013-06-29: qty 10

## 2013-06-29 MED ORDER — ACETAMINOPHEN 325 MG PO TABS: 650.0000 mg | ORAL_TABLET | ORAL | Status: DC | PRN

## 2013-06-29 MED ORDER — LIDOCAINE HCL (PF) 1 % IJ SOLN
30.0000 mL | INTRAMUSCULAR | Status: DC | PRN
Start: 2013-06-29 — End: 2013-06-30
  Filled 2013-06-29: qty 30

## 2013-06-29 MED ORDER — FENTANYL 2.5 MCG/ML BUPIVACAINE 1/10 % EPIDURAL INFUSION (WH - ANES)
14.0000 mL/h | INTRAMUSCULAR | Status: DC | PRN
  Administered 2013-06-29 (×2): 14 mL/h via EPIDURAL
  Filled 2013-06-29 (×2): qty 125

## 2013-06-29 MED ORDER — FENTANYL CITRATE 0.05 MG/ML IJ SOLN: 100.0000 ug | INTRAMUSCULAR | Status: DC | PRN

## 2013-06-29 MED ORDER — LACTATED RINGERS IV SOLN: 500.0000 mL | Freq: Once | INTRAVENOUS | Status: DC

## 2013-06-29 MED ORDER — EPHEDRINE 5 MG/ML INJ
10.0000 mg | INTRAVENOUS | Status: DC | PRN
  Filled 2013-06-29: qty 2
  Filled 2013-06-29: qty 4

## 2013-06-29 MED ORDER — LACTATED RINGERS IV SOLN: 500.0000 mL | INTRAVENOUS | Status: DC | PRN

## 2013-06-29 MED ORDER — LACTATED RINGERS IV SOLN
INTRAVENOUS | Status: DC
  Administered 2013-06-29 (×2): via INTRAVENOUS

## 2013-06-29 MED ORDER — OXYTOCIN BOLUS FROM INFUSION
500.0000 mL | INTRAVENOUS | Status: DC
  Administered 2013-06-29: 500 mL via INTRAVENOUS

## 2013-06-29 MED ORDER — LIDOCAINE HCL (PF) 1 % IJ SOLN
INTRAMUSCULAR | Status: DC | PRN
  Administered 2013-06-29 (×2): 5 mL

## 2013-06-29 MED ORDER — DIPHENHYDRAMINE HCL 50 MG/ML IJ SOLN: 12.5000 mg | INTRAMUSCULAR | Status: DC | PRN

## 2013-06-29 MED ORDER — IBUPROFEN 600 MG PO TABS: 600.0000 mg | ORAL_TABLET | Freq: Four times a day (QID) | ORAL | Status: DC | PRN

## 2013-06-29 MED ORDER — EPHEDRINE 5 MG/ML INJ
10.0000 mg | INTRAVENOUS | Status: DC | PRN
  Administered 2013-06-29: 10 mg via INTRAVENOUS
  Filled 2013-06-29: qty 2

## 2013-06-29 NOTE — H&P (Signed)
[name] [name] is a 28 y.o. female G2P1001 with IUP at 2935w2d presenting for contractions. While in MAU pt water ruptured with clear fluid. Pt states she has been having irregular, every 5-10 minutes contractions, associated with none vaginal bleeding.  Membranes are ruptured, clear fluid, with active fetal movement.   [facility]  Prenatal History/Complications: uncomplciated prenatal care  Past Medical History: Past Medical History  Diagnosis Date  . Asthma     exercise induced in middle school  . Complication of anesthesia     laughing gas, dreaming episode  . GERD (gastroesophageal reflux disease)   . Eczema     Past Surgical History: Past Surgical History  Procedure Laterality Date  . Wisdom tooth extraction  2003    Obstetrical History: OB History   Grav Para Term Preterm Abortions TAB SAB Ect Mult Living   2 1 1  0 0 0 0 0 0 1      Gynecological History: OB History   Grav Para Term Preterm Abortions TAB SAB Ect Mult Living   2 1 1  0 0 0 0 0 0 1      Social History: History   Social History  . Marital Status: Married    Spouse Name: N/A    Number of Children: N/A  . Years of Education: N/A   Social History Main Topics  . Smoking status: Never Smoker   . Smokeless tobacco: Never Used  . Alcohol Use: No  . Drug Use: No  . Sexual Activity: Yes    Birth Control/ Protection: None     Comment: pregnant   Other Topics Concern  . None   Social History Narrative  . None    Family History: Family History  Problem Relation Age of Onset  . Drug abuse Maternal Grandmother   . Heart disease Maternal Grandmother   . Diabetes Paternal Grandmother   . Hypertension Paternal Grandmother   . Stroke Paternal Grandfather   . COPD Paternal Grandfather     Allergies: No Known Allergies  Facility-administered medications prior to admission  Medication Dose Route Frequency Provider Last Rate Last Dose  . Tdap (BOOSTRIX) injection 0.5 mL  0.5 mL  Intramuscular Once [place] Smith, CNM       Prescriptions prior to admission  Medication Sig Dispense Refill  . acetaminophen (TYLENOL) 325 MG tablet Take 650 mg by mouth every 6 (six) hours as needed.        . cyclobenzaprine (FLEXERIL) 10 MG tablet Take 10 mg by mouth 3 (three) times daily as needed for muscle spasms.      . hydrocortisone-pramoxine (PROCTOFOAM HC) rectal foam Place 1 applicator rectally 2 (two) times daily.  10 g  1  . Prenatal Vit-Fe Fumarate-FA (MULTIVITAMIN-PRENATAL) 27-0.8 MG TABS Take 1 tablet by mouth daily.        . ranitidine (ZANTAC) 150 MG capsule Take 150 mg by mouth daily.         Review of Systems   Constitutional: no complaints  Blood pressure 135/71, pulse 79, resp. rate 18, height 5\' 8"  (1.727 m), weight 115.667 kg (255 lb), last menstrual period 09/20/2012, currently breastfeeding. General appearance: alert, cooperative, appears stated age and no distress Lungs: clear to auscultation bilaterally Heart: regular rate and rhythm Abdomen: soft, non-tender; bowel sounds normal Pelvic: adequate Extremities: Homans sign is negative, no sign of DVT DTR's 2+ Presentation: cephalic Fetal monitoringBaseline: 120s bpm, Variability: Good {> 6 bpm), Accelerations: Reactive and Decelerations: Absent Uterine activity infrequent  5381m  Dilation: 5.5 Effacement (%): 90 Station: -1 Exam by:: K.Wilson,RN   Prenatal labs: ABO, Rh: O/POS/-- (09/04 1543) Antibody: NEG (09/04 1543) Rubella:   RPR: NON REAC (01/16 0851)  HBsAg: NEGATIVE (09/04 1543)  HIV: NON REACTIVE (01/16 0851)  GBS:   Neg  Clinic  [place]  Dating LMP consistent with 27 wk US  Genetic Screen Declines  Anatomic US Nl, F/U heart views:  GTT Early:               Third trimester: 110  TDaP vaccine 04/2013  Flu vaccine 10/14  GBS  Neg  Baby Food Breast  Contraception POP  Circumcision IP  Pediatrician [place]      Prenatal Transfer Tool  Maternal Diabetes: No Genetic  Screening: Normal Maternal Ultrasounds/Referrals: Normal Fetal Ultrasounds or other Referrals:  None Maternal Substance Abuse:  No Significant Maternal Medications:  None Significant Maternal Lab Results: Lab values include: Group B Strep negative     Results for orders placed during the hospital encounter of 06/29/13 (from the past 24 hour(s))  POCT FERN TEST   Collection Time    06/29/13  3:50 PM      Result Value Ref Range   POCT Fern Test Positive = ruptured amniotic membanes    CBC   Collection Time    06/29/13  4:05 PM      Result Value Ref Range   WBC 13.3 (*) 4.0 - 10.5 K/uL   RBC 3.83 (*) 3.87 - 5.11 MIL/uL   Hemoglobin 11.6 (*) 12.0 - 15.0 g/dL   HCT 16.134.6 (*) 09.636.0 - 04.546.0 %   MCV 90.3  78.0 - 100.0 fL   MCH 30.3  26.0 - 34.0 pg   MCHC 33.5  30.0 - 36.0 g/dL   RDW 40.913.7  81.111.5 - 91.415.5 %   Platelets 189  150 - 400 K/uL    Assessment: [name] [name] is a 28 y.o. G2P1001 at 5525w2d by L=27 here for SOOL/SROM #Labor: expectant management, if no ctx in 3hrs, will augment with pitocin #Pain: Desires IV pain meds and epidural #FWB: Cat I #ID:  GBS Neg #MOF: Breast #MOC: POP/OCP #Circ:  Inpatient  Minta BalsamMichael R Alishah Schulte 06/29/2013, 4:56 PM

## 2013-06-29 NOTE — Anesthesia Preprocedure Evaluation (Signed)
Anesthesia Evaluation  Patient identified by MRN, date of birth, ID band Patient awake    Reviewed: Allergy & Precautions, H&P , Patient's Chart, lab work & pertinent test results  Airway Mallampati: III TM Distance: >3 FB Neck ROM: full    Dental   Pulmonary asthma ,  breath sounds clear to auscultation        Cardiovascular Rhythm:regular Rate:Normal     Neuro/Psych    GI/Hepatic GERD-  ,  Endo/Other    Renal/GU      Musculoskeletal   Abdominal   Peds  Hematology   Anesthesia Other Findings   Reproductive/Obstetrics (+) Pregnancy                           Anesthesia Physical Anesthesia Plan  ASA: II  Anesthesia Plan: Epidural   Post-op Pain Management:    Induction:   Airway Management Planned:   Additional Equipment:   Intra-op Plan:   Post-operative Plan:   Informed Consent: I have reviewed the patients History and Physical, chart, labs and discussed the procedure including the risks, benefits and alternatives for the proposed anesthesia with the patient or authorized representative who has indicated his/her understanding and acceptance.     Plan Discussed with:   Anesthesia Plan Comments:         Anesthesia Quick Evaluation

## 2013-06-29 NOTE — Progress Notes (Signed)
[name] [name] is a 28 y.o. G2P1001 at 432w2d admitted for active labor, rupture of membranes  Subjective: S/p Epidural and comfortable  Objective: BP 128/58  Pulse 94  Resp 18  Ht 5\' 8"  (1.727 m)  Wt 115.667 kg (255 lb)  BMI 38.78 kg/m2  LMP 09/20/2012      FHT:  FHR: 125 bpm, variability: moderate,  accelerations:  Present,  decelerations:  Absent UC:   irregular, every 2-4 minutes SVE:   Dilation: 6 Effacement (%): 80 Station: -1 Exam by:: Currie ParisL. Lima, RN  Labs: Lab Results  Component Value Date   WBC 13.3* 06/29/2013   HGB 11.6* 06/29/2013   HCT 34.6* 06/29/2013   MCV 90.3 06/29/2013   PLT 189 06/29/2013    Assessment / Plan: Spontaneous labor, progressing normally  Labor: Progressing normally Preeclampsia:  no signs or symptoms of toxicity Fetal Wellbeing:  Category I Pain Control:  Epidural I/D:  n/a Anticipated MOD:  NSVD  [name] [name] 06/29/2013, 8:26 PM

## 2013-06-29 NOTE — Anesthesia Procedure Notes (Signed)
Epidural Patient location during procedure: OB Start time: 06/29/2013 6:05 PM  Staffing Anesthesiologist: Brayton CavesJACKSON, Victor Langenbach Performed by: anesthesiologist   Preanesthetic Checklist Completed: patient identified, site marked, surgical consent, pre-op evaluation, timeout performed, IV checked, risks and benefits discussed and monitors and equipment checked  Epidural Patient position: sitting Prep: site prepped and draped and DuraPrep Patient monitoring: continuous pulse ox and blood pressure Approach: midline Location: L3-L4 Injection technique: LOR air  Needle:  Needle type: Tuohy  Needle gauge: 17 G Needle length: 9 cm and 9 Needle insertion depth: 6 cm Catheter type: closed end flexible Catheter size: 19 Gauge Catheter at skin depth: 11 cm Test dose: negative  Assessment Events: blood not aspirated, injection not painful, no injection resistance, negative IV test and no paresthesia  Additional Notes Patient identified.  Risk benefits discussed including failed block, incomplete pain control, headache, nerve damage, paralysis, blood pressure changes, nausea, vomiting, reactions to medication both toxic or allergic, and postpartum back pain.  Patient expressed understanding and wished to proceed.  All questions were answered.  Sterile technique used throughout procedure and epidural site dressed with sterile barrier dressing. No paresthesia or other complications noted.The patient did not experience any signs of intravascular injection such as tinnitus or metallic taste in mouth nor signs of intrathecal spread such as rapid motor block. Please see nursing notes for vital signs.

## 2013-06-29 NOTE — MAU Note (Signed)
Contractions started at 0530, now every 5 min. No bleeding, ? Leaking when first got up this morning.  Was 1 cm when checked 2 wks ago.

## 2013-06-30 ENCOUNTER — Encounter (HOSPITAL_COMMUNITY): Payer: Self-pay | Admitting: *Deleted

## 2013-06-30 ENCOUNTER — Encounter: Payer: BC Managed Care – PPO | Admitting: Advanced Practice Midwife

## 2013-06-30 DIAGNOSIS — J45909 Unspecified asthma, uncomplicated: Secondary | ICD-10-CM

## 2013-06-30 MED ORDER — MEASLES, MUMPS & RUBELLA VAC ~~LOC~~ INJ: 0.5000 mL | INJECTION | Freq: Once | SUBCUTANEOUS | Status: DC

## 2013-06-30 MED ORDER — OXYCODONE-ACETAMINOPHEN 5-325 MG PO TABS: 1.0000 | ORAL_TABLET | ORAL | Status: DC | PRN

## 2013-06-30 MED ORDER — PRENATAL MULTIVITAMIN CH
1.0000 | ORAL_TABLET | Freq: Every day | ORAL | Status: DC
  Filled 2013-06-30: qty 1

## 2013-06-30 MED ORDER — LANOLIN HYDROUS EX OINT: TOPICAL_OINTMENT | CUTANEOUS | Status: DC | PRN

## 2013-06-30 MED ORDER — SIMETHICONE 80 MG PO CHEW: 80.0000 mg | CHEWABLE_TABLET | ORAL | Status: DC | PRN

## 2013-06-30 MED ORDER — FLEET ENEMA 7-19 GM/118ML RE ENEM: 1.0000 | ENEMA | Freq: Every day | RECTAL | Status: DC | PRN

## 2013-06-30 MED ORDER — BENZOCAINE-MENTHOL 20-0.5 % EX AERO
1.0000 "application " | INHALATION_SPRAY | CUTANEOUS | Status: DC | PRN
  Administered 2013-06-30: 1 via TOPICAL
  Filled 2013-06-30: qty 56

## 2013-06-30 MED ORDER — ONDANSETRON HCL 4 MG PO TABS: 4.0000 mg | ORAL_TABLET | ORAL | Status: DC | PRN

## 2013-06-30 MED ORDER — IBUPROFEN 600 MG PO TABS
600.0000 mg | ORAL_TABLET | Freq: Four times a day (QID) | ORAL | Status: DC
  Administered 2013-06-30 – 2013-07-01 (×6): 600 mg via ORAL
  Filled 2013-06-30 (×7): qty 1

## 2013-06-30 MED ORDER — OXYTOCIN 40 UNITS IN LACTATED RINGERS INFUSION - SIMPLE MED: 62.5000 mL/h | INTRAVENOUS | Status: DC | PRN

## 2013-06-30 MED ORDER — TETANUS-DIPHTH-ACELL PERTUSSIS 5-2.5-18.5 LF-MCG/0.5 IM SUSP: 0.5000 mL | Freq: Once | INTRAMUSCULAR | Status: DC

## 2013-06-30 MED ORDER — DIBUCAINE 1 % RE OINT
1.0000 "application " | TOPICAL_OINTMENT | RECTAL | Status: DC | PRN
  Administered 2013-06-30: 1 via RECTAL
  Filled 2013-06-30: qty 28

## 2013-06-30 MED ORDER — METHYLERGONOVINE MALEATE 0.2 MG PO TABS: 0.2000 mg | ORAL_TABLET | ORAL | Status: DC | PRN

## 2013-06-30 MED ORDER — WITCH HAZEL-GLYCERIN EX PADS
1.0000 "application " | MEDICATED_PAD | CUTANEOUS | Status: DC | PRN
  Administered 2013-06-30: 1 via TOPICAL

## 2013-06-30 MED ORDER — ONDANSETRON HCL 4 MG/2ML IJ SOLN: 4.0000 mg | INTRAMUSCULAR | Status: DC | PRN

## 2013-06-30 MED ORDER — DIPHENHYDRAMINE HCL 25 MG PO CAPS: 25.0000 mg | ORAL_CAPSULE | Freq: Four times a day (QID) | ORAL | Status: DC | PRN

## 2013-06-30 MED ORDER — ZOLPIDEM TARTRATE 5 MG PO TABS: 5.0000 mg | ORAL_TABLET | Freq: Every evening | ORAL | Status: DC | PRN

## 2013-06-30 MED ORDER — METHYLERGONOVINE MALEATE 0.2 MG/ML IJ SOLN: 0.2000 mg | INTRAMUSCULAR | Status: DC | PRN

## 2013-06-30 MED ORDER — FERROUS SULFATE 325 (65 FE) MG PO TABS
325.0000 mg | ORAL_TABLET | Freq: Two times a day (BID) | ORAL | Status: DC
  Administered 2013-06-30 – 2013-07-01 (×3): 325 mg via ORAL
  Filled 2013-06-30 (×3): qty 1

## 2013-06-30 MED ORDER — BISACODYL 10 MG RE SUPP: 10.0000 mg | Freq: Every day | RECTAL | Status: DC | PRN

## 2013-06-30 MED ORDER — SENNOSIDES-DOCUSATE SODIUM 8.6-50 MG PO TABS
2.0000 | ORAL_TABLET | ORAL | Status: DC
  Administered 2013-07-01: 2 via ORAL
  Filled 2013-06-30: qty 2

## 2013-06-30 NOTE — Progress Notes (Addendum)
Post Partum Day 1 from NSVD at 23:30 on 06/29/2013  Subjective: [name] [name] has no complaints. Pain is well controlled. Up ad lib, tolerating PO and + flatus. Decreased vaginal bleeding. Patient is breastfeeding and plans on having circumcision done in the hospital. [facility] patient will use Progesterone only OCP.  Objective: Blood pressure 103/62, pulse 66, temperature 98.5 F (36.9 C), temperature source Oral, resp. rate 18, height 5\' 8"  (1.727 m), weight 115.667 kg (255 lb), last menstrual period 09/20/2012, unknown if currently breastfeeding.  Physical Exam:  General: alert, cooperative and appears stated age Lochia: appropriate Uterine Fundus: firm Incision: n/a DVT Evaluation: No evidence of DVT seen on physical exam. Negative Homan's sign. No cords or calf tenderness. No significant calf/ankle edema.   Recent Labs  06/29/13 1605  HGB 11.6*  HCT 34.6*    Assessment/Plan: Plan for discharge tomorrow   LOS: 1 day   Wallis BambergMario Mani 06/30/2013, 8:10 AM   I have seen and examined this patient and agree with above documentation in the PA student's note.   Rulon AbideKeli Alto Gandolfo, M.D. [facility] 06/30/2013 9:32 AM

## 2013-06-30 NOTE — Lactation Note (Signed)
This note was copied from the chart of [name]. Lactation Consultation Note Experienced mom of 17 months BF first baby. Complications w/Hx; of thrush. States BF going well so far with this baby. WH/LC brochure given w/resources, support groups and LC services. Mother informed of post-discharge support and given phone number to the lactation department, including services for phone call assistance; out-patient appointments; and breastfeeding support group. List of other breastfeeding resources in the community given in the handout. Encouraged mother to call for problems or concerns related to breastfeeding. Encouraged to call for assistance or concerns. Patient Name: [name] QMVHQ'IToday's Date: 06/30/2013     Maternal Data Formula Feeding for Exclusion: No Infant to breast within first hour of birth: Yes Has patient been taught Hand Expression?: No Does the patient have breastfeeding experience prior to this delivery?: Yes  Feeding Feeding Type: Breast Fed Length of feed: 20 min  LATCH Score/Interventions Latch: Grasps breast easily, tongue down, lips flanged, rhythmical sucking.  Audible Swallowing: Spontaneous and intermittent  Type of Nipple: Everted at rest and after stimulation  Comfort (Breast/Nipple): Soft / non-tender     Hold (Positioning): No assistance needed to correctly position infant at breast.  LATCH Score: 10  Lactation Tools Discussed/Used     Consult Status      Charyl DancerLaura G Kearra Calkin 06/30/2013, 2:40 AM

## 2013-06-30 NOTE — Progress Notes (Signed)
Circumcision Counseling Progress Note  Patient desires circumcision for her female infant.  Circumcision procedure details discussed, risks and benefits of procedure were also discussed.  These include but are not limited to: Benefits of circumcision in men include reduction in the rates of urinary tract infection (UTI), penile cancer, some sexually transmitted infections, penile inflammatory and retractile disorders, as well as easier hygiene.  Risks include bleeding , infection, injury of glans which may lead to penile deformity or urinary tract issues, unsatisfactory cosmetic appearance and other potential complications related to the procedure.  It was emphasized that this is an elective procedure.  Patient wants to proceed with circumcision; written informed consent obtained.   Rulon AbideKeli Chanise Habeck, M.D. [facility] 06/30/2013 9:33 AM

## 2013-06-30 NOTE — Anesthesia Postprocedure Evaluation (Signed)
Anesthesia Post Note  Patient: [name] [name]  Procedure(s) Performed: * No procedures listed *  Anesthesia type: Epidural  Patient location: Mother/Baby  Post pain: Pain level controlled  Post assessment: Post-op Vital signs reviewed  Last Vitals:  Filed Vitals:   06/30/13 0645  BP: 103/62  Pulse: 66  Temp: 36.9 C  Resp: 18    Post vital signs: Reviewed  Level of consciousness: awake  Complications: No apparent anesthesia complications

## 2013-07-01 MED ORDER — NORETHINDRONE 0.35 MG PO TABS: 1.0000 | ORAL_TABLET | Freq: Every day | ORAL | Status: AC

## 2013-07-01 MED ORDER — IBUPROFEN 600 MG PO TABS: 600.0000 mg | ORAL_TABLET | Freq: Four times a day (QID) | ORAL | Status: AC

## 2013-07-01 NOTE — Discharge Instructions (Signed)

## 2013-07-01 NOTE — Lactation Note (Signed)
This note was copied from the chart of [name] Valentina LucksJessica Feutz. Lactation Consultation Note Experienced BF of 1 1/2 yr. With !st. Child. States this baby doesn't want to open mouth very wide causing some soreness other than that feeding going well. No stripes noted. Has good breast anatomy. Encouraged chin tug, stated she was doing that. Comfort gels given. Encouraged to call Consult Line for after d/c questions or f/u needs. Patient Name: [name] [name] [id number]'s Date: 07/01/2013     Maternal Data    Feeding Feeding Type: Breast Fed Length of feed: 10 min  [facility]                      Lactation Tools Discussed/Used     Consult Status      Charyl DancerLaura G Holleigh Crihfield 07/01/2013, 10:43 AM

## 2013-07-01 NOTE — Discharge Summary (Signed)
Attestation of Attending Supervision of Fellow: Evaluation and management procedures were performed by the Fellow under my supervision and collaboration.  I have reviewed the Fellow's note and chart, and I agree with the management and plan.    

## 2013-07-01 NOTE — Discharge Summary (Signed)
  Obstetric Discharge Summary Reason for Admission: onset of labor and rupture of membranes Prenatal Procedures: none Intrapartum Procedures: spontaneous vaginal delivery Postpartum Procedures: none Complications-Operative and Postpartum: 2nd degree perineal laceration  Hospital Course:  At 2330, after about a 30 minute 2nd stage a viable female was delivered via (Presentation:LOA ). A small midline episiotomy had been cut just prior to delivery, as the baby was bradycardic and the tissues weren't stretching. APGAR:6/9 ; weight 9#7oz. The shoulders were not forthcoming, so the posterior (L) axilla was grasped with my index finger, and the baby was rotated clockwise into the oblique diameter. At this point, the (now) anterior shoulder was released, and the baby delivered. At no time was any traction placed on the baby's head. Cord pH: pending  40 units of pitocin diluted in 1000cc LR was infused rapidly IV. The placenta separated spontaneously and delivered via CCT and maternal pushing effort. It was inspected and appears to be intact with a 3 VC.  There were the following complications: mild shoulder dystocia X 45 seconds  Anesthesia: Epidural  Episiotomy: 2nd lac, repaired by Wallis BambergMario Mani PA-S under my supervision  Lacerations: no extension  Suture Repair: 3.0 chromic  Est. Blood Loss (mL): 350  Mom to postpartum. Baby to Couplet care / Skin to Skin.   H/H: Lab Results  Component Value Date/Time   HGB 11.6* 06/29/2013  4:05 PM   HCT 34.6* 06/29/2013  4:05 PM    Filed Vitals:   07/01/13 0541  BP: 118/82  Pulse: 68  Temp: 98 F (36.7 C)  Resp: 18    Physical Exam: VSS NAD Abd: Appropriately tender, ND, Fundus @U -1 No c/c/e, Neg homan's sign, neg cords Lochia Appropriate  Discharge Diagnoses: Term Pregnancy-delivered  Discharge Information: Date: 09/25/2010 Activity: pelvic rest Diet: routine  Medications: PNV and Ibuprofen  And micronor Breast feeding:  Yes Condition:  stable Instructions: refer to handout Discharge to: home      Medication List    STOP taking these medications       ranitidine 150 MG capsule  Commonly known as:  ZANTAC      TAKE these medications       acetaminophen 325 MG tablet  Commonly known as:  TYLENOL  Take 650 mg by mouth every 6 (six) hours as needed.     cyclobenzaprine 10 MG tablet  Commonly known as:  FLEXERIL  Take 10 mg by mouth 3 (three) times daily as needed for muscle spasms.     hydrocortisone-pramoxine rectal foam  Commonly known as:  PROCTOFOAM HC  Place 1 applicator rectally 2 (two) times daily.     ibuprofen 600 MG tablet  Commonly known as:  ADVIL,MOTRIN  Take 1 tablet (600 mg total) by mouth every 6 (six) hours.     multivitamin-prenatal 27-0.8 MG Tabs tablet  Take 1 tablet by mouth daily.     norethindrone 0.35 MG tablet  Commonly known as:  ORTHO MICRONOR  Take 1 tablet (0.35 mg total) by mouth daily.           Follow-up Information   [facility] [place] Schedule an appointment as soon as possible for a visit in 4 weeks. (For Postpartum Visit)    Specialty:  Obstetrics and Gynecology   Contact information:   [address], [address] [place] [phone]      [name] BalsamMichael R Jahzeel [name] 07/01/2013,8:25 AM

## 2013-07-03 NOTE — H&P (Signed)
Attestation of Attending Supervision of Obstetric Fellow: Evaluation and management procedures were performed by the Obstetric Fellow under my supervision and collaboration.  I have reviewed the Obstetric Fellow's note and chart, and I agree with the management and plan.  Katieann Hungate, MD, FACOG Attending Obstetrician & Gynecologist Faculty Practice, [facility]   

## 2013-07-06 ENCOUNTER — Telehealth: Payer: Self-pay | Admitting: *Deleted

## 2013-07-06 DIAGNOSIS — B3789 Other sites of candidiasis: Secondary | ICD-10-CM

## 2013-07-06 MED ORDER — FLUCONAZOLE 200 MG PO TABS: 200.0000 mg | ORAL_TABLET | Freq: Every day | ORAL | Status: DC

## 2013-07-06 NOTE — Telephone Encounter (Signed)
Pt called in with c/o intraductal yeast

## 2013-07-06 NOTE — Telephone Encounter (Signed)
[facility] to treat ductal yeast. Also called in [employer] compounded Nipple Cream to [facility]

## 2013-07-16 ENCOUNTER — Encounter: Payer: Self-pay | Admitting: Obstetrics & Gynecology

## 2013-07-17 ENCOUNTER — Telehealth: Payer: Self-pay | Admitting: *Deleted

## 2013-07-17 DIAGNOSIS — B3789 Other sites of candidiasis: Secondary | ICD-10-CM

## 2013-07-17 MED ORDER — FLUCONAZOLE 200 MG PO TABS: 200.0000 mg | ORAL_TABLET | Freq: Every day | ORAL | Status: DC

## 2013-07-17 NOTE — Telephone Encounter (Signed)
Pt request RF on Fluconazole for ductal yeast.  She took 3 rounds after last pregnancy.

## 2013-07-28 ENCOUNTER — Encounter: Payer: Self-pay | Admitting: Advanced Practice Midwife

## 2013-07-28 ENCOUNTER — Ambulatory Visit (INDEPENDENT_AMBULATORY_CARE_PROVIDER_SITE_OTHER): Payer: BC Managed Care – PPO | Admitting: Advanced Practice Midwife

## 2013-07-28 VITALS — BP 120/71 | HR 62 | Resp 16 | Ht 68.0 in | Wt 226.0 lb

## 2013-07-28 DIAGNOSIS — B3789 Other sites of candidiasis: Secondary | ICD-10-CM

## 2013-07-28 DIAGNOSIS — K644 Residual hemorrhoidal skin tags: Secondary | ICD-10-CM

## 2013-07-28 NOTE — Progress Notes (Signed)
  Subjective:     [name] [name] is a 28 y.o. female who presents for a postpartum visit. She is 4 weeks postpartum following a spontaneous vaginal delivery. I have fully reviewed the prenatal and intrapartum course. The delivery was at 39 gestational weeks. Outcome: spontaneous vaginal delivery and tight shoulders/mild dystocia resolved with fetal rotation by CNM. Anesthesia: epidural. Postpartum course has been normal. Baby's course has been normal. Baby is feeding by breast. Does report symptoms of candida of breasts including sharp breast pain, erythema of nipples and infant has mild diaper rash consistent with candida. Bleeding staining only. Bowel function is contipation. Bladder function is normal. Patient is not sexually active. Contraception method is oral progesterone-only contraceptive. Postpartum depression screening: negative.  The following portions of the patient's history were reviewed and updated as appropriate: allergies, current medications, past family history, past medical history, past social history, past surgical history and problem list.  Review of Systems A comprehensive review of systems was negative.   Objective:    BP 120/71  Pulse 62  Resp 16  Ht 5\' 8"  (1.727 m)  Wt 226 lb (102.513 kg)  BMI 34.37 kg/m2  Breastfeeding? Yes  General:  alert, cooperative and no distress   Breasts:  inspection negative, no nipple discharge or bleeding, no masses or nodularity palpable  Lungs: clear to auscultation bilaterally  Heart:  regular rate and rhythm, S1, S2 normal, no murmur, click, rub or gallop  Abdomen: soft, non-tender; bowel sounds normal; no masses,  no organomegaly   Vulva:  normal  Vagina: normal vagina, no discharge, exudate, lesion, or erythema  Cervix:  not evaluated  Corpus: not examined  Adnexa:  not evaluated  Rectal Exam: external hemorrhoids        Assessment:     Normal postpartum exam. Constipation with external hemorrhoids    Plan:    1.  Contraception: oral progesterone-only contraceptive 2. Recommend increase fiber and fluids for constipation, continue stool softener BID 3.  Renew Diflucan x10 days for candida. Recommend continue all purpose nipple cream, try to air dry with expressed breast milk on nipples as often as possible. 4. Follow up in: 1 year for well-woman exam or as needed.

## 2013-07-29 ENCOUNTER — Encounter: Payer: Self-pay | Admitting: Obstetrics & Gynecology

## 2013-07-31 ENCOUNTER — Other Ambulatory Visit: Payer: Self-pay | Admitting: *Deleted

## 2013-07-31 DIAGNOSIS — B3789 Other sites of candidiasis: Secondary | ICD-10-CM

## 2013-07-31 MED ORDER — FLUCONAZOLE 200 MG PO TABS: 200.0000 mg | ORAL_TABLET | Freq: Every day | ORAL | Status: DC

## 2013-07-31 NOTE — Telephone Encounter (Signed)
Rf on Flucon[facility]

## 2014-01-15 ENCOUNTER — Encounter: Payer: Self-pay | Admitting: Advanced Practice Midwife

## 2014-10-01 ENCOUNTER — Telehealth: Payer: Self-pay | Admitting: *Deleted

## 2014-10-01 ENCOUNTER — Encounter: Payer: Self-pay | Admitting: Obstetrics & Gynecology

## 2014-10-01 DIAGNOSIS — N760 Acute vaginitis: Secondary | ICD-10-CM

## 2014-10-01 MED ORDER — FLUCONAZOLE 150 MG PO TABS: 150.0000 mg | ORAL_TABLET | Freq: Once | ORAL | Status: DC

## 2014-10-01 NOTE — Telephone Encounter (Signed)
LM for pt to return call to office and leave pharmacy name to call in RX for Diflucan per Dr legget.

## 2014-10-01 NOTE — Telephone Encounter (Signed)
Pt called requesting a RX for Diflucan 150 mg.  Spoke with Dr Penne LashLeggett and she authorized a RX.  [facility]

## 2014-11-03 ENCOUNTER — Encounter: Payer: Self-pay | Admitting: Obstetrics & Gynecology

## 2014-11-05 ENCOUNTER — Other Ambulatory Visit: Payer: Self-pay | Admitting: *Deleted

## 2014-11-05 DIAGNOSIS — N760 Acute vaginitis: Secondary | ICD-10-CM

## 2014-11-05 DIAGNOSIS — B372 Candidiasis of skin and nail: Secondary | ICD-10-CM

## 2014-11-05 MED ORDER — FLUCONAZOLE 150 MG PO TABS
150.0000 mg | ORAL_TABLET | Freq: Once | ORAL | Status: AC
Start: 2014-11-05 — End: ?
